# Patient Record
Sex: Female | Born: 1945 | Race: Black or African American | Hispanic: No | Marital: Single
Health system: Midwestern US, Community
[De-identification: ages and names within clinical notes are randomized; demographics above are authoritative.]

## PROBLEM LIST (undated history)

## (undated) DIAGNOSIS — K219 Gastro-esophageal reflux disease without esophagitis: Secondary | ICD-10-CM

## (undated) DIAGNOSIS — E785 Hyperlipidemia, unspecified: Secondary | ICD-10-CM

## (undated) DIAGNOSIS — F329 Major depressive disorder, single episode, unspecified: Secondary | ICD-10-CM

## (undated) DIAGNOSIS — M5410 Radiculopathy, site unspecified: Secondary | ICD-10-CM

## (undated) DIAGNOSIS — M549 Dorsalgia, unspecified: Secondary | ICD-10-CM

## (undated) DIAGNOSIS — H269 Unspecified cataract: Secondary | ICD-10-CM

## (undated) DIAGNOSIS — F32A Depression, unspecified: Secondary | ICD-10-CM

## (undated) DIAGNOSIS — B1920 Unspecified viral hepatitis C without hepatic coma: Secondary | ICD-10-CM

## (undated) DIAGNOSIS — M3500 Sicca syndrome, unspecified: Secondary | ICD-10-CM

## (undated) DIAGNOSIS — G629 Polyneuropathy, unspecified: Secondary | ICD-10-CM

## (undated) DIAGNOSIS — F331 Major depressive disorder, recurrent, moderate: Secondary | ICD-10-CM

## (undated) DIAGNOSIS — K621 Rectal polyp: Secondary | ICD-10-CM

## (undated) HISTORY — DX: Dorsalgia, unspecified: M54.9

## (undated) HISTORY — DX: Depression, unspecified: F32.A

## (undated) HISTORY — DX: Unspecified viral hepatitis C without hepatic coma: B19.20

## (undated) HISTORY — DX: Gastro-esophageal reflux disease without esophagitis: K21.9

## (undated) HISTORY — DX: Major depressive disorder, single episode, unspecified: F32.9

## (undated) HISTORY — DX: Sjogren syndrome, unspecified: M35.00

## (undated) HISTORY — DX: Unspecified cataract: H26.9

## (undated) HISTORY — PX: ABDOMINAL HYSTERECTOMY: SHX81

## (undated) HISTORY — DX: Hyperlipidemia, unspecified: E78.5

## (undated) HISTORY — DX: Radiculopathy, site unspecified: M54.10

---

## 2010-10-22 DIAGNOSIS — F17200 Nicotine dependence, unspecified, uncomplicated: Secondary | ICD-10-CM | POA: Insufficient documentation

## 2010-10-22 DIAGNOSIS — F609 Personality disorder, unspecified: Secondary | ICD-10-CM | POA: Insufficient documentation

## 2010-11-27 DIAGNOSIS — M2550 Pain in unspecified joint: Secondary | ICD-10-CM | POA: Insufficient documentation

## 2010-11-27 DIAGNOSIS — IMO0002 Reserved for concepts with insufficient information to code with codable children: Secondary | ICD-10-CM | POA: Insufficient documentation

## 2010-12-27 DIAGNOSIS — G629 Polyneuropathy, unspecified: Secondary | ICD-10-CM | POA: Insufficient documentation

## 2010-12-27 DIAGNOSIS — L280 Lichen simplex chronicus: Secondary | ICD-10-CM | POA: Insufficient documentation

## 2011-07-05 DIAGNOSIS — M549 Dorsalgia, unspecified: Secondary | ICD-10-CM | POA: Insufficient documentation

## 2011-11-09 DIAGNOSIS — M792 Neuralgia and neuritis, unspecified: Secondary | ICD-10-CM | POA: Insufficient documentation

## 2011-11-09 DIAGNOSIS — G894 Chronic pain syndrome: Secondary | ICD-10-CM | POA: Insufficient documentation

## 2013-02-08 DIAGNOSIS — F410 Panic disorder [episodic paroxysmal anxiety] without agoraphobia: Secondary | ICD-10-CM | POA: Insufficient documentation

## 2013-10-15 ENCOUNTER — Other Ambulatory Visit (HOSPITAL_COMMUNITY)
Admission: RE | Admit: 2013-10-15 | Discharge: 2013-10-15 | Disposition: A | Discharge status: Home / Self Care | Payer: Medicare Other | Source: Ambulatory Visit | Attending: Family Medicine | Admitting: Family Medicine

## 2013-10-15 DIAGNOSIS — Z1151 Encounter for screening for human papillomavirus (HPV): Secondary | ICD-10-CM | POA: Insufficient documentation

## 2013-10-15 DIAGNOSIS — Z124 Encounter for screening for malignant neoplasm of cervix: Secondary | ICD-10-CM | POA: Insufficient documentation

## 2014-05-11 ENCOUNTER — Ambulatory Visit: Payer: Self-pay | Admitting: Internal Medicine

## 2014-05-17 DIAGNOSIS — M3501 Sicca syndrome with keratoconjunctivitis: Secondary | ICD-10-CM | POA: Diagnosis not present

## 2014-05-17 DIAGNOSIS — H2513 Age-related nuclear cataract, bilateral: Secondary | ICD-10-CM | POA: Diagnosis not present

## 2014-05-17 DIAGNOSIS — H5371 Glare sensitivity: Secondary | ICD-10-CM | POA: Diagnosis not present

## 2014-06-03 DIAGNOSIS — M79674 Pain in right toe(s): Secondary | ICD-10-CM | POA: Diagnosis not present

## 2014-06-28 ENCOUNTER — Ambulatory Visit: Payer: Self-pay | Admitting: Internal Medicine

## 2014-11-11 DIAGNOSIS — M3500 Sicca syndrome, unspecified: Secondary | ICD-10-CM | POA: Insufficient documentation

## 2015-06-29 DIAGNOSIS — H25813 Combined forms of age-related cataract, bilateral: Secondary | ICD-10-CM | POA: Insufficient documentation

## 2015-06-29 DIAGNOSIS — H04123 Dry eye syndrome of bilateral lacrimal glands: Secondary | ICD-10-CM | POA: Insufficient documentation

## 2015-06-29 DIAGNOSIS — H02831 Dermatochalasis of right upper eyelid: Secondary | ICD-10-CM | POA: Insufficient documentation

## 2015-06-29 DIAGNOSIS — H02834 Dermatochalasis of left upper eyelid: Secondary | ICD-10-CM

## 2015-10-11 LAB — HM COLONOSCOPY

## 2016-08-20 ENCOUNTER — Telehealth: Payer: Self-pay

## 2016-08-20 NOTE — Telephone Encounter (Signed)
Completed.

## 2016-08-21 ENCOUNTER — Ambulatory Visit (INDEPENDENT_AMBULATORY_CARE_PROVIDER_SITE_OTHER): Payer: 59 | Admitting: Medical

## 2016-08-21 ENCOUNTER — Encounter: Payer: Self-pay | Admitting: Medical

## 2016-08-21 VITALS — BP 138/80 | HR 68 | Temp 98.6°F | Resp 16 | Ht 65.0 in | Wt 130.0 lb

## 2016-08-21 DIAGNOSIS — H269 Unspecified cataract: Secondary | ICD-10-CM

## 2016-08-21 DIAGNOSIS — E785 Hyperlipidemia, unspecified: Secondary | ICD-10-CM

## 2016-08-21 DIAGNOSIS — M3500 Sicca syndrome, unspecified: Secondary | ICD-10-CM

## 2016-08-21 DIAGNOSIS — F325 Major depressive disorder, single episode, in full remission: Secondary | ICD-10-CM | POA: Diagnosis not present

## 2016-08-21 DIAGNOSIS — G8929 Other chronic pain: Secondary | ICD-10-CM | POA: Diagnosis not present

## 2016-08-21 DIAGNOSIS — L819 Disorder of pigmentation, unspecified: Secondary | ICD-10-CM

## 2016-08-21 DIAGNOSIS — M5410 Radiculopathy, site unspecified: Secondary | ICD-10-CM

## 2016-08-21 DIAGNOSIS — K219 Gastro-esophageal reflux disease without esophagitis: Secondary | ICD-10-CM | POA: Diagnosis not present

## 2016-08-21 DIAGNOSIS — M5441 Lumbago with sciatica, right side: Secondary | ICD-10-CM | POA: Diagnosis not present

## 2016-08-21 NOTE — Progress Notes (Signed)
Subjective:    Patient ID: Latoya Cummings, female    DOB: 1945/10/11, 71 y.o.   MRN: [id number]  HPI  Pt states she feel ok today.  She updates me on her history. She has lumbar back pain history. No acute pain presetnl.   Some depression hx- In the past she was on medication. She states depression is controlled. In past she saw counselor/therapist.  Some gerd hx- she is on protonix. It does help.  She request derm referral. Pt has hx of  Hyperpigmented lesions on her feet. Worse on left heel.  Also she request rheumatologist referral as well. Pt has Sjogren syndrome. Pt states Dr Nickola Major.  Pt also request ophthalmologist referral. She has history of cataracts.   Review of Systems  Constitutional: Negative for chills and fever.  HENT: Negative for congestion, drooling, ear pain, hearing loss, postnasal drip, sinus pain and sneezing.   Eyes: Negative for redness and itching.       Hx of cataracts.  Respiratory: Negative for choking, shortness of breath and wheezing.   Cardiovascular: Negative for chest pain and palpitations.  Gastrointestinal: Negative for abdominal distention, abdominal pain, anal bleeding, blood in stool, diarrhea, rectal pain and vomiting.       Controlled gerd.  Genitourinary: Negative for dysuria and enuresis.  Musculoskeletal: Negative for back pain.       Radicular pain rt lower ext.  Low back pain and rt hip pain.  Skin: Negative for rash.       See hpi skin hyperpigmentation of feet.  Neurological: Negative for facial asymmetry and headaches.  Hematological: Negative for adenopathy. Does not bruise/bleed easily.  Psychiatric/Behavioral: Positive for dysphoric mood. Negative for behavioral problems, confusion, hallucinations, self-injury and suicidal ideas. The patient is not nervous/anxious.        Hx of but stable.    Past Medical History:  Diagnosis Date  . Depression   . Hyperlipidemia      Social History   Social History  . Marital  status: Single    Spouse name: N/A  . Number of children: N/A  . Years of education: N/A   Occupational History  . Not on file.   Social History Main Topics  . Smoking status: Never Smoker  . Smokeless tobacco: Never Used  . Alcohol use No  . Drug use: No  . Sexual activity: Not on file   Other Topics Concern  . Not on file   Social History Narrative  . No narrative on file    Past Surgical History:  Procedure Laterality Date  . CHOLECYSTECTOMY      Family History  Problem Relation Age of Onset  . Cancer Mother     Allergies  Allergen Reactions  . Tetanus Toxoids   . Tape Rash  . Tuberculin Ppd Rash    No current outpatient prescriptions on file prior to visit.   No current facility-administered medications on file prior to visit.     BP (!) 142/47   Pulse 68   Temp 98.6 F (37 C) (Oral)   Resp 16   Ht 5\' 5"  (1.651 m)   Wt 130 lb (59 kg)   SpO2 100%   BMI 21.63 kg/m       Objective:   Physical Exam  General Mental Status- Alert. General Appearance- Not in acute distress.   Skin General: Color- Normal Color. Moisture- Normal Moisture.  Bottom of her feet slight hyperpigmented area worse on heels  Neck Carotid Arteries- Normal color.  Moisture- Normal Moisture. No carotid bruits. No JVD.  Chest and Lung Exam Auscultation: Breath Sounds:-Normal.  Cardiovascular Auscultation:Rythm- Regular. Murmurs & Other Heart Sounds:Auscultation of the heart reveals- No Murmurs.  Abdomen Inspection:-Inspeection Normal. Palpation/Percussion:Note:No mass. Palpation and Percussion of the abdomen reveal- Non Tender, Non Distended + BS, no rebound or guarding.   Neurologic Cranial Nerve exam:- CN III-XII intact(No nystagmus), symmetric smile. Strength:- 5/5 equal and symmetric strength both upper and lower extremities.   Back No Mid lumbar spine tenderness to palpation. No Pain on straight leg lift. No Pain on lateral movements and flexion/extension  of the spine. But lying supine reports little pain in rt upper hamstring area.  Lower ext neurologic  L5-S1 sensation intact bilaterally. Normal patellar reflexes bilaterally. No foot drop bilaterally.      Assessment & Plan:  I have made the various referrals that you have request as listed on your summary.  For gerd continue protonix.   For depression which appears stable contact counselor office then let us know who you called and we will help facilitate the referral.  On orthopedist referral I have placed back specialist but will but that on hold until you call with more specifics.  Please signs release of information so we can get prior pcp recordds.  Follow up in 3 wks or as needed.(early morning)  Sutton Hirsch, Ramon DredgeEdward, PA-C

## 2016-08-21 NOTE — Patient Instructions (Addendum)
I have made the various referrals that you have request as listed on your summary.(for update can speak with Alen BleacherKrist or Victorino DikeJennifer)  For gerd continue protonix.   For depression which appears stable contact counselor office then let us know who you called and we will help facilitate the referral.  On orthopedist referral I have placed back specialist but will but that on hold until you call with more specifics.  Please signs release of information so we can get prior pcp recordds.  Follow up in 3 wks or as needed.(early morning)  For high cholesterol hx will get cmp and lipid panel future orer  Pt told needs shingles vaccine. She states insurance told her to get and covered.

## 2016-09-17 ENCOUNTER — Encounter: Payer: Self-pay | Admitting: Medical

## 2016-09-17 ENCOUNTER — Ambulatory Visit (HOSPITAL_BASED_OUTPATIENT_CLINIC_OR_DEPARTMENT_OTHER)
Admission: RE | Admit: 2016-09-17 | Discharge: 2016-09-17 | Disposition: A | Discharge status: Home / Self Care | Payer: Medicare Other | Source: Ambulatory Visit | Attending: Medical | Admitting: Medical

## 2016-09-17 ENCOUNTER — Ambulatory Visit (INDEPENDENT_AMBULATORY_CARE_PROVIDER_SITE_OTHER): Payer: 59 | Admitting: Psychology

## 2016-09-17 ENCOUNTER — Ambulatory Visit (INDEPENDENT_AMBULATORY_CARE_PROVIDER_SITE_OTHER): Payer: Medicare Other | Admitting: Medical

## 2016-09-17 VITALS — BP 129/51 | HR 64 | Temp 98.4°F | Resp 16 | Ht 65.0 in | Wt 131.0 lb

## 2016-09-17 DIAGNOSIS — M79609 Pain in unspecified limb: Secondary | ICD-10-CM | POA: Diagnosis not present

## 2016-09-17 DIAGNOSIS — E785 Hyperlipidemia, unspecified: Secondary | ICD-10-CM

## 2016-09-17 DIAGNOSIS — F331 Major depressive disorder, recurrent, moderate: Secondary | ICD-10-CM

## 2016-09-17 DIAGNOSIS — M79605 Pain in left leg: Secondary | ICD-10-CM | POA: Diagnosis not present

## 2016-09-17 LAB — COMPREHENSIVE METABOLIC PANEL
ALK PHOS: 62 U/L (ref 39–117)
ALT: 16 U/L (ref 0–35)
AST: 29 U/L (ref 0–37)
Albumin: 4.5 g/dL (ref 3.5–5.2)
BUN: 12 mg/dL (ref 6–23)
CHLORIDE: 103 meq/L (ref 96–112)
CO2: 32 meq/L (ref 19–32)
Calcium: 10.3 mg/dL (ref 8.4–10.5)
Creatinine, Ser: 0.77 mg/dL (ref 0.40–1.20)
GFR: 95.51 mL/min (ref >60.00)
GLUCOSE: 97 mg/dL (ref 70–99)
POTASSIUM: 4.1 meq/L (ref 3.5–5.1)
SODIUM: 141 meq/L (ref 135–145)
TOTAL PROTEIN: 8.1 g/dL (ref 6.0–8.3)
Total Bilirubin: 0.5 mg/dL (ref 0.2–1.2)

## 2016-09-17 LAB — LIPID PANEL
Cholesterol: 171 mg/dL (ref 0–200)
HDL: 53.3 mg/dL (ref >39.00)
LDL CALC: 96 mg/dL (ref 0–99)
NonHDL: 117.89
Total CHOL/HDL Ratio: 3
Triglycerides: 111 mg/dL (ref 0.0–149.0)
VLDL: 22.2 mg/dL (ref 0.0–40.0)

## 2016-09-17 NOTE — Patient Instructions (Addendum)
For your popliteal region pain present for one year or more, I ordered right lower extremity ultrasound. Appointment downstairs with radiology is at 11:30 am.  I suspect that you might have a Baker's cyst. You are you have orthopedic referral in place and getting result of the ultrasound would be helpful.  Please get the future labs are placed.  Attend counseling appointment today.  Follow-up date to be determined after review of ultrasound report.

## 2016-09-17 NOTE — Progress Notes (Signed)
Subjective:    Patient ID: Latoya Cummings, female    DOB: Feb 01, 1945, 71 y.o.   MRN: [id number]  HPI  Pt states history of some back pain in past. But no pain recently.   Pt does report some popliteal pain for about one year. Slight bulge to he popliteal area. Pt told possible baker cyst.  But no US done. Pain is constant and daily. No pretibial swelling and no shortness of breath. On review pt thinks after working out on treadmill thought little pain while exercising May have caused the pain.   Pt states that some pain in popliteal fossa at time radiates up and down leg.   Review of Systems  Constitutional: Negative for chills, fatigue and fever.  Respiratory: Negative for cough, chest tightness, shortness of breath and wheezing.   Cardiovascular: Negative for chest pain and palpitations.  Gastrointestinal: Negative for abdominal pain, anal bleeding, blood in stool, constipation, nausea and vomiting.  Musculoskeletal:       Rt lower ext popliteal region pain.  Neurological: Negative for dizziness, seizures, speech difficulty, weakness, light-headedness and headaches.  Hematological: Negative for adenopathy. Does not bruise/bleed easily.  Psychiatric/Behavioral: Negative for behavioral problems and confusion.    Past Medical History:  Diagnosis Date  . Back pain   . Cataract   . Depression   . Depression   . GERD (gastroesophageal reflux disease)   . Hyperlipidemia   . Radicular pain   . Sjogren's disease [facility])      Social History   Social History  . Marital status: Single    Spouse name: N/A  . Number of children: N/A  . Years of education: N/A   Occupational History  . Not on file.   Social History Main Topics  . Smoking status: Never Smoker  . Smokeless tobacco: Never Used  . Alcohol use No  . Drug use: No  . Sexual activity: No   Other Topics Concern  . Not on file   Social History Narrative  . No narrative on file    Past Surgical History:  Procedure  Laterality Date  . CHOLECYSTECTOMY      Family History  Problem Relation Age of Onset  . Cancer Mother     Allergies  Allergen Reactions  . Tetanus Toxoids   . Tape Rash  . Tuberculin Ppd Rash    Current Outpatient Prescriptions on File Prior to Visit  Medication Sig Dispense Refill  . aspirin EC 81 MG tablet Take by mouth daily.    Marland Kitchen gabapentin (NEURONTIN) 800 MG tablet Take 800 mg by mouth 2 (two) times daily.     Marland Kitchen ibuprofen (ADVIL,MOTRIN) 200 MG tablet Take 800 mg by mouth daily as needed.     . pantoprazole (PROTONIX) 40 MG tablet      No current facility-administered medications on file prior to visit.     BP (!) 129/51   Pulse 64   Temp 98.4 F (36.9 C) (Oral)   Resp 16   Ht  (1.651 m)   Wt 131 lb (59.4 kg)   SpO2 100%   BMI 21.80 kg/m      Objective:   Physical Exam  General- No acute distress. Pleasant patient. Neck- Full range of motion, no jvd Lungs- Clear, even and unlabored. Heart- regular rate and rhythm. Neurologic- CNII- XII grossly intact.  Rt lower ext- faint bulge on inspection of popliteal fossa. Faint pain rt side homans testing.      Assessment & Plan:  For your popliteal region pain present for one year or more, I ordered right lower extremity ultrasound. Appointment downstairs with radiology is at 11:30 am.  I suspect that you might have a Baker's cyst. You are you have orthopedic referral in place and getting result of the ultrasound would be helpful.  Please get the future labs are placed.  Attend counseling appointment today.  Follow-up date to be determined after review of ultrasound report.  Febe Champa, Ramon DredgeEdward, PA-C

## 2016-09-23 ENCOUNTER — Ambulatory Visit (INDEPENDENT_AMBULATORY_CARE_PROVIDER_SITE_OTHER): Payer: Medicaid Other

## 2016-09-23 ENCOUNTER — Ambulatory Visit (INDEPENDENT_AMBULATORY_CARE_PROVIDER_SITE_OTHER): Payer: 59

## 2016-09-23 ENCOUNTER — Ambulatory Visit (INDEPENDENT_AMBULATORY_CARE_PROVIDER_SITE_OTHER): Payer: Medicaid Other | Admitting: Orthopedic Surgery

## 2016-09-23 ENCOUNTER — Encounter (INDEPENDENT_AMBULATORY_CARE_PROVIDER_SITE_OTHER): Payer: Self-pay | Admitting: Orthopedic Surgery

## 2016-09-23 DIAGNOSIS — M79672 Pain in left foot: Secondary | ICD-10-CM | POA: Diagnosis not present

## 2016-09-23 DIAGNOSIS — M25561 Pain in right knee: Secondary | ICD-10-CM

## 2016-09-23 DIAGNOSIS — M5410 Radiculopathy, site unspecified: Secondary | ICD-10-CM

## 2016-09-26 NOTE — Progress Notes (Signed)
Office Visit Note   Patient: Latoya Cummings           Date of Birth: Aug 25, 1945           MRN: [id number] Visit Date: 09/23/2016 Requested by: Esperanza Richters, PA-C [address] [place], [place] [address] PCP: Marisue Brooklyn  Subjective: Chief Complaint  Patient presents with  . Right Knee - Pain  . Left Foot - Pain    HPI: Latoya Cummings is a 71 year old patient with multiple medical complaints.  She describes right lower extremity radicular pain radiating up into the groin.  She has an MRI scan which is reviewed.  The report states that at L3-4 there is facet hypertrophy and ligamentum flavum thickening which results in mild central and lateral recess stenosis.  The disc does contact the right greater than left descending L4 nerve roots.  There is a small right foraminal disc herniation present.  She describes pain for 2 years with no history of injury.  Does report numbness and tingling in the leg as well as groin pain.  She has been to physical therapy on 2 occasions.  Patient also describes right knee pain posteriorly with a radiating component down the leg.  Denies any mechanical symptoms of the knee swelling or history of injury.  No popping or clicking noted in the knee by description.  Patient also reports left foot pain.  She states that her symptoms all related to a small bunion she has along the first MTP joint.  She's had that pain for 1-2 years.  She can only wear certain shoes.  She has had radiographs done elsewhere.  She did not bring her records.              ROS: All systems reviewed are negative as they relate to the chief complaint within the history of present illness.  Patient denies  fevers or chills.   Assessment & Plan: Visit Diagnoses:  1. Radicular leg pain   2. 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  This should respond well to an injection.  Refer to Dr. Alvester Morin for that  problem.  I think the knee pain is likely related to referred pain from the back.  Her knee exam is normal and radiographs show no abnormalities.  In regards to the foot I think that she does have a very small bunion on the medial aspect of the first metatarsal.  I'll have her see Dr. Lajoyce Corners for further evaluation and management of that problem should she elect for surgical intervention.  Follow-Up Instructions: No Follow-up on file.   Orders:  Orders Placed This Encounter  Procedures  . XR Pelvis 1-2 Views  . XR KNEE 3 VIEW RIGHT  . XR Foot Complete Left  . Ambulatory referral to Physical Medicine Rehab   No orders of the defined types were placed in this encounter.     Procedures: No procedures performed   Clinical Data: No additional findings.  Objective: Vital Signs: There were no vitals taken for this visit.  Physical Exam:   Constitutional: Patient appears well-developed HEENT:  Head: Normocephalic Eyes:EOM are normal Neck: Normal range of motion Cardiovascular: Normal rate Pulmonary/chest: Effort normal Neurologic: Patient is alert Skin: Skin is warm Psychiatric: Patient has normal mood and affect    Ortho Exam: Orthopedic exam demonstrates palpable pedal pulses good ankle dorsi flexion plantar flexion quite hamstring strength.  Symmetric reflexes.  Negative Babinski negative clonus.  Little bit of L45 paresthesias on the right compared to the left.  Nerve root tension signs present on the right negative on the left.  There is some pain with forward lateral bending.  Examination of the left foot demonstrates reasonable range of motion at the first MTP joint with palpable intact nontender in 2 to posterior tib peroneal and Achilles tendons.  Symmetric tibiotalar subtalar transverse tarsal range of motion is present.  No other masses lymph adenopathy or skin changes noted in that lower extremity region.  Knee examination demonstrates full active and passive range of  motion stable collateral crucial ligaments intact extensor mechanism palpable pedal pulses no other masses lymph adenopathy or skin changes noted in the right knee region.  Range of motion is full.  Specialty Comments:  No specialty comments available.  Imaging: No results found.   PMFS History: There are no active problems to display for this patient.  Past Medical History:  Diagnosis Date  . Back pain   . Cataract   . Depression   . Depression   . GERD (gastroesophageal reflux disease)   . Hyperlipidemia   . Radicular pain   . Sjogren's disease (HCC)     Family History  Problem Relation Age of Onset  . Cancer Mother     Past Surgical History:  Procedure Laterality Date  . CHOLECYSTECTOMY     Social History   Occupational History  . Not on file.   Social History Main Topics  . Smoking status: Never Smoker  . Smokeless tobacco: Never Used  . Alcohol use No  . Drug use: No  . Sexual activity: No

## 2016-10-02 ENCOUNTER — Encounter (INDEPENDENT_AMBULATORY_CARE_PROVIDER_SITE_OTHER): Payer: Self-pay | Admitting: Physical Medicine and Rehabilitation

## 2016-10-02 ENCOUNTER — Ambulatory Visit (INDEPENDENT_AMBULATORY_CARE_PROVIDER_SITE_OTHER): Payer: 59 | Admitting: Physical Medicine and Rehabilitation

## 2016-10-02 VITALS — BP 120/62 | HR 60

## 2016-10-02 DIAGNOSIS — M25561 Pain in right knee: Secondary | ICD-10-CM | POA: Diagnosis not present

## 2016-10-02 DIAGNOSIS — M25551 Pain in right hip: Secondary | ICD-10-CM

## 2016-10-02 DIAGNOSIS — G8929 Other chronic pain: Secondary | ICD-10-CM | POA: Diagnosis not present

## 2016-10-02 DIAGNOSIS — M5416 Radiculopathy, lumbar region: Secondary | ICD-10-CM | POA: Diagnosis not present

## 2016-10-02 NOTE — Progress Notes (Deleted)
Pain posterior right knee, right thigh, right groin. Feels like "pulling" sensation in knee. No back pain. Had MRI in [facility] ordered by her neurologist and was told the pain may be coming from her back. The pain is always there, but it increases with activity such as walking fast. She also asks about a possible referral to a neurologist within [facility].

## 2016-10-04 ENCOUNTER — Telehealth: Payer: Self-pay | Admitting: Medical

## 2016-10-04 NOTE — Telephone Encounter (Signed)
Pt called states it is urgent that [employer] call her today after 1:00 pm. Pt did not want to leave info as to why she needs to speak to him. [phone].

## 2016-10-04 NOTE — Telephone Encounter (Signed)
Pt called back in to request a call back Explained that provider is currently seeing pt's but would send message   CB: [phone]

## 2016-10-07 ENCOUNTER — Other Ambulatory Visit: Payer: Self-pay | Admitting: Medical

## 2016-10-07 LAB — HM MAMMOGRAPHY

## 2016-10-07 NOTE — Telephone Encounter (Signed)
Pt states she needs a different dermatology referral for her feet. Call pt [phone].  Pt states she will have her pharmacy fax over her medication refill requests so she can be sure it is correct.

## 2016-10-08 ENCOUNTER — Telehealth: Payer: Self-pay | Admitting: Medical

## 2016-10-08 ENCOUNTER — Ambulatory Visit (INDEPENDENT_AMBULATORY_CARE_PROVIDER_SITE_OTHER): Payer: 59 | Admitting: Physical Medicine and Rehabilitation

## 2016-10-08 ENCOUNTER — Ambulatory Visit (INDEPENDENT_AMBULATORY_CARE_PROVIDER_SITE_OTHER): Payer: Medicare Other

## 2016-10-08 VITALS — BP 137/68 | HR 71 | Temp 98.9°F

## 2016-10-08 DIAGNOSIS — M25551 Pain in right hip: Secondary | ICD-10-CM | POA: Diagnosis not present

## 2016-10-08 DIAGNOSIS — M5416 Radiculopathy, lumbar region: Secondary | ICD-10-CM | POA: Diagnosis not present

## 2016-10-08 MED ORDER — BETAMETHASONE SOD PHOS & ACET 6 (3-3) MG/ML IJ SUSP
12.0000 mg | Freq: Once | INTRAMUSCULAR | Status: AC
  Administered 2016-10-08: 12 mg

## 2016-10-08 MED ORDER — LIDOCAINE HCL (PF) 1 % IJ SOLN
2.0000 mL | Freq: Once | INTRAMUSCULAR | Status: AC
  Administered 2016-10-08: 2 mL

## 2016-10-08 NOTE — Procedures (Signed)
[name] is a 71 year old female hear for planned right L4 transforaminal injection.  Please see prior evaluation and management note for details and justification.   Lumbosacral Transforaminal Epidural Steroid Injection - Sub-Pedicular Approach with Fluoroscopic Guidance  Patient: Latoya Cummings      Date of Birth: 1946-01-04 MRN: [id number] PCP: Esperanza Richters, PA-C      Visit Date: 10/08/2016   Universal Protocol:    Date/Time: 10/08/2016  Consent Given By: the patient  Position: PRONE  Additional Comments: Vital signs were monitored before and after the procedure. Patient was prepped and draped in the usual sterile fashion. The correct patient, procedure, and site was verified.   Injection Procedure Details:  Procedure Site One Meds Administered:  Meds ordered this encounter  Medications  . lidocaine (PF) (XYLOCAINE) 1 % injection 2 mL  . betamethasone acetate-betamethasone sodium phosphate (CELESTONE) injection 12 mg    Laterality: Right  Location/Site:  L4-L5  Needle size: 22 G  Needle type: Spinal  Needle Placement: Transforaminal  Findings:  -Contrast Used: 0.5 mL iohexol 180 mg iodine/mL   -Comments: Excellent flow of contrast along the nerve and into the epidural space.  Procedure Details: After squaring off the end-plates to get a true AP view, the C-arm was positioned so that an oblique view of the foramen as noted above was visualized. The target area is just inferior to the "nose of the scotty dog" or sub pedicular. The soft tissues overlying this structure were infiltrated with 2-3 ml. of 1% Lidocaine without Epinephrine.  The spinal needle was inserted toward the target using a "trajectory" view along the fluoroscope beam.  Under AP and lateral visualization, the needle was advanced so it did not puncture dura and was located close the 6 O'Clock position of the pedical in AP tracterory. Biplanar projections were used to confirm position. Aspiration was  confirmed to be negative for CSF and/or blood. A 1-2 ml. volume of Isovue-250 was injected and flow of contrast was noted at each level. Radiographs were obtained for documentation purposes.   After attaining the desired flow of contrast documented above, a 0.5 to 1.0 ml test dose of 0.25% Marcaine was injected into each respective transforaminal space.  The patient was observed for 90 seconds post injection.  After no sensory deficits were reported, and normal lower extremity motor function was noted,   the above injectate was administered so that equal amounts of the injectate were placed at each foramen (level) into the transforaminal epidural space.   Additional Comments:  The patient tolerated the procedure well Dressing: Band-Aid    Post-procedure details: Patient was observed during the procedure. Post-procedure instructions were reviewed.  Patient left the clinic in stable condition.

## 2016-10-08 NOTE — Patient Instructions (Signed)

## 2016-10-08 NOTE — Telephone Encounter (Signed)
I did see my dermatology comments buried in the body of the  Note. I did send a referral request back to [facility] you call patient and let her know to call Victorino Dike or [place] in one week if nobody has called her by then.

## 2016-10-08 NOTE — Telephone Encounter (Signed)
Referral sent again, awaiting appt

## 2016-10-08 NOTE — Telephone Encounter (Signed)
Would someone call back patient and clarify why she wants to be referred to dermatologist? I referred him to physical medicine doctor. And in that physical medicine doctor there is mention of neurology referral?  So I'm not sure how dermatology came up?  In my last note I don't see anything about her dermatologic condition that she mentioned. So patient has a skin condition that needs to be addressed I would like to see her first before referring.

## 2016-10-08 NOTE — Progress Notes (Deleted)
Patient here today with right sided leg pain. She denies any actual back pain, but having entire leg pain.

## 2016-10-08 NOTE — Telephone Encounter (Signed)
Will you see the 08/21/2016 referral and placed again with the dermatologist. Apparently they never called her back and patient continues to try to get dermatologist referral.

## 2016-10-08 NOTE — Telephone Encounter (Signed)
Called pt she confirmed Latoya Cummings saw her here on 08/21/16 for an office visit for multiple issues including her hyperpigmentation of her feet. Edward placed a dermatology referral on 08/22/16 to [facility] Dr Joanna Puff. Pt states she has been unsuccessful in reaching them to schedule so she would like a referral to a different dermatology group. As far as her meds, she says [facility] in [place] is going to fax over refill req for Gabapentin and Flonase and to be on the lookout for those.

## 2016-10-11 ENCOUNTER — Telehealth (INDEPENDENT_AMBULATORY_CARE_PROVIDER_SITE_OTHER): Payer: Self-pay | Admitting: Radiology

## 2016-10-11 NOTE — Telephone Encounter (Signed)
Called pt lmg per Esperanza Richters instructions to have pt contact our office in one week if dermatology has not contacted her to make appt.

## 2016-10-11 NOTE — Telephone Encounter (Signed)
Patient called and LM to tell you guys congratulations, that she is feeling so much better, the injection went well and she is walking so much better now.  Thanks to all the [facility] staff, you were wonderful.

## 2016-10-22 ENCOUNTER — Encounter (INDEPENDENT_AMBULATORY_CARE_PROVIDER_SITE_OTHER): Payer: Self-pay | Admitting: Physical Medicine and Rehabilitation

## 2016-10-22 NOTE — Progress Notes (Signed)
Latoya Cummings - 71 y.o. female MRN [id number]  Date of birth: [birth year]  Office Visit Note: Visit Date: 10/02/2016 PCP: Esperanza Richters, PA-C Referred by: Esperanza Richters, PA-C  Subjective: Chief Complaint  Patient presents with  . Right Hip - Pain  . Right Knee - Pain  . Right Leg - Pain   HPI: Latoya Cummings is a 71 year old female recently seen in our office by Dr. August Saucer for orthopedic complaints. He felt like a lot of her pain may be coming from a disc herniation at L3-4 with some irritation of the L4 nerve roots and mild to moderate stenosis. We had her coming in today for evaluation because of multiple areas of complaints just to see if we can figure out where her pain was really originating from. She reports pain in the posterior right knee. Dr. August Saucer evaluating this and didn't feel like it was intrinsic knee issues. She has pain in the right thigh and right groin. She feels that there is a pulling sensation in the knee. She doesn't really report much in the way of back pain per se. She reports constant pain that increases with activity such as walking and in particular walking fast. She has failed conservative care with medication management and therapy. She reports an MRI of her lumbar spine was performed [facility] by her neurologist that she was seeing. Dr. August Saucer reviewed this and I reviewed this with her today and is reviewed below. It does show really mild bulging and facet hypertrophy at L3-4 with some possible irritation of either L4 nerve root. She has similar findings at L4-5. 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 All this was discussed with her. She also asked today about a referral to a neurologist within the [facility]. I did review some of the notes of the neurologist at [facility] been trying to manage her back and leg pain. It looks like electrodiagnostic study was performed but I cannot see the results of  this. She has not had prior lumbar surgery. She has not had prior injections of the lumbar spine. She denies any real paresthesias.    Review of Systems  Constitutional: Negative for chills, fever, malaise/fatigue and weight loss.  HENT: Negative for hearing loss and sinus pain.   Eyes: Negative for blurred vision, double vision and photophobia.  Respiratory: Negative for cough and shortness of breath.   Cardiovascular: Negative for chest pain, palpitations and leg swelling.  Gastrointestinal: Negative for abdominal pain, nausea and vomiting.  Genitourinary: Negative for flank pain.  Musculoskeletal: Positive for back pain and joint pain. Negative for myalgias.  Skin: Negative for itching and rash.  Neurological: Negative for tremors, focal weakness and weakness.  Endo/Heme/Allergies: Negative.   Psychiatric/Behavioral: Negative for depression.  All other systems reviewed and are negative.  Otherwise per HPI.  Assessment & Plan: Visit Diagnoses:  1. Pain in right hip   2. Chronic pain of right knee   3. Lumbar radiculopathy     Plan: Findings:  Chronic worsening severe at times right hip and leg pain and knee pain which could be related to the spine. She has symptoms that seem to be a little bit all over the place in terms of mechanical and possibly nerve root irritation. I think the fact that she has not had diagnostic transforaminal-type injection it would be worthwhile to complete a right L4 transforaminal injection which would be the nerve root he would  expect to probably give her pain in the knee and leg area. If she does not get relief with this that I would have her follow up with Dr. August Saucer for her knee pain. Her lumbar spine in general doesn't look very bad. She has some mild age-related findings with mild disc bulging and mild to moderate narrowing but no specific nerve root compression. There is this right foraminal disc herniation L3-4 which is smaller and lateral recess narrowing  as well. She will continue on current medications. I would ask her to follow-up with her primary care physician for referral to a [facility] neurologist. Her primary care physician is with a  health care.     Meds & Orders: No orders of the defined types were placed in this encounter.  No orders of the defined types were placed in this encounter.   Follow-up: Return for Right L4 transforaminal epidural steroid injection.   Procedures: No procedures performed  No notes on file   Clinical History: MRI LUMBAR SPINE WITHOUT CONTRAST, 06/11/2016 2:58 PM   INDICATION: LOW BACK PAIN, >6 WKS / RED FLAG(S) / RADICULOPATHY \ \ M54.10 Radicular leg pain   COMPARISON: None.   TECHNIQUE: Multiplanar, multi-sequence surface-coil MR imaging of the lumbar spine was performed without contrast.   LEVELS IMAGED: Lower thoracic to upper sacral region.   FINDINGS:  Alignment: Within normal limits. Vertebrae: Mild generalized heterogeneity of marrow signal is nonspecific, but can be seen in the setting of chronic anemia, chronic hypoxia (such as in smokers) versus other systemic metabolic disorders. No definite focal marrow replacing lesion suggestive of neoplasm. Conus: Terminates in borderline low position at the inferior margin of the L2 endplate. Visualized distal cord and conus are normal in signal and contour.   T12-L1: No significant focal abnormality. L1-L2: Mild facet hypertrophy and ligamentum flavum thickening. No canal or foraminal stenosis. L2-L3: Mild facet hypertrophy and ligamentum flavum thickening. Minimal disc bulge. No canal or foraminal stenosis. L3-L4: Mild disc bulge with facet hypertrophy and ligamentum flavum thickening that result in mild central and lateral recess stenosis. Disc contacts the right greater than left descending L4 nerve roots. Small right foraminal disc herniation with minimal right foraminal narrowing. L4-L5: Disc bulge and facet  hypertrophy/ligamentum flavum thickening. These changes result in mild to moderate central and lateral recess stenosis. No substantial canal stenosis. L5-S1: Mild disc bulge with facet hypertrophy and ligamentum flavum thickening. Mild narrowing of the lateral recesses. No substantial foraminal stenosis. Upper sacrum/ilium: Right S2-S3 Tarlov cyst that mildly deforms the S2 nerve root. Other Result Information Interface, Rad Results In - 06/11/2016  3:13 PM EDT MRI LUMBAR SPINE WITHOUT CONTRAST, 06/11/2016 2:58 PM   INDICATION: LOW BACK PAIN, >6 WKS / RED FLAG(S) / RADICULOPATHY \ \ M54.10 Radicular leg pain   COMPARISON: None.   TECHNIQUE: Multiplanar, multi-sequence surface-coil MR imaging of the lumbar spine was performed without contrast.   LEVELS IMAGED: Lower thoracic to upper sacral region.   FINDINGS:  Alignment: Within normal limits. Vertebrae: Mild generalized heterogeneity of marrow signal is nonspecific, but can be seen in the setting of chronic anemia, chronic hypoxia (such as in smokers) versus other systemic metabolic disorders. No definite focal marrow replacing lesion suggestive of neoplasm. Conus: Terminates in borderline low position at the inferior margin of the L2 endplate. Visualized distal cord and conus are normal in signal and contour.      T12-L1: No significant focal abnormality. L1-L2: Mild facet hypertrophy and ligamentum flavum thickening. No canal or foraminal  stenosis. L2-L3: Mild facet hypertrophy and ligamentum flavum thickening. Minimal disc bulge. No canal or foraminal stenosis. L3-L4: Mild disc bulge with facet hypertrophy and ligamentum flavum thickening that result in mild central and lateral recess stenosis. Disc contacts the right greater than left descending L4 nerve roots. Small right foraminal disc herniation with minimal right foraminal narrowing. L4-L5: Disc bulge and facet hypertrophy/ligamentum flavum thickening. These changes result in mild  to moderate central and lateral recess stenosis. No substantial canal stenosis. L5-S1: Mild disc bulge with facet hypertrophy and ligamentum flavum thickening. Mild narrowing of the lateral recesses. No substantial foraminal stenosis. Upper sacrum/ilium: Right S2-S3 Tarlov cyst that mildly deforms the S2 nerve root.   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 Disc bulge at L3-L4 with a small superimposed right foraminal disc herniation, but no substantial foraminal stenosis.  Small right S2-S3 sacral Tarlov cyst mildly deforms the adjacent S2 nerve root.  Borderline low position of the conus ending at the inferior L2 endplate, but otherwise normal signal and appearance of the distal cord and conus.  She reports that she has never smoked. She has never used smokeless tobacco. No results for input(s): HGBA1C, LABURIC in the last 8760 hours.  Objective:  VS:  HT:    WT:   BMI:     BP:120/62  HR:60bpm  TEMP: ( )  RESP:  Physical Exam  Constitutional: She is oriented to person, place, and time. She appears well-developed and well-nourished.  Eyes: Pupils are equal, round, and reactive to light. Conjunctivae and EOM are normal.  Cardiovascular: Normal rate and intact distal pulses.   Pulmonary/Chest: Effort normal.  Musculoskeletal:  Patient ambulates without a she is somewhat slow to rise from a seated position. She does have pain with extension of the lumbar spine. She has mild pain over the right greater trochanter PSIS compared to the left. She is not having any groin pain with hip rotation. Examination of the knee shows no swelling and no joint line tenderness. She has good varus and valgus instability. She has good distal strength without clonus.  Neurological: She is alert and oriented to person, place, and time. She exhibits normal muscle tone.  Skin: Skin is warm and dry. No rash noted. No  erythema.  Psychiatric: She has a normal mood and affect. Her behavior is normal.  Nursing note and vitals reviewed.   Ortho Exam Imaging: No results found.  Past Medical/Family/Surgical/Social History: Medications & Allergies reviewed per EMR There are no active problems to display for this patient.  Past Medical History:  Diagnosis Date  . Back pain   . Cataract   . Depression   . Depression   . GERD (gastroesophageal reflux disease)   . Hyperlipidemia   . Radicular pain   . Sjogren's disease (HCC)    Family History  Problem Relation Age of Onset  . Cancer Mother    Past Surgical History:  Procedure Laterality Date  . CHOLECYSTECTOMY     Social History   Occupational History  . Not on file.   Social History Main Topics  . Smoking status: Never Smoker  . Smokeless tobacco: Never Used  . Alcohol use No  . Drug use: No  . Sexual activity: No

## 2016-10-28 ENCOUNTER — Other Ambulatory Visit (HOSPITAL_COMMUNITY): Payer: Self-pay

## 2016-10-28 DIAGNOSIS — R1310 Dysphagia, unspecified: Secondary | ICD-10-CM

## 2016-11-07 ENCOUNTER — Ambulatory Visit (HOSPITAL_COMMUNITY)
Admission: RE | Admit: 2016-11-07 | Discharge: 2016-11-07 | Disposition: A | Discharge status: Home / Self Care | Payer: Medicare Other | Source: Ambulatory Visit | Attending: Physician Assistant | Admitting: Physician Assistant

## 2016-11-07 ENCOUNTER — Ambulatory Visit (HOSPITAL_COMMUNITY)
Admission: RE | Admit: 2016-11-07 | Discharge: 2016-11-07 | Disposition: A | Discharge status: Home / Self Care | Payer: 59 | Source: Ambulatory Visit | Attending: Medical | Admitting: Medical

## 2016-11-07 DIAGNOSIS — K219 Gastro-esophageal reflux disease without esophagitis: Secondary | ICD-10-CM | POA: Diagnosis not present

## 2016-11-07 DIAGNOSIS — F329 Major depressive disorder, single episode, unspecified: Secondary | ICD-10-CM | POA: Diagnosis not present

## 2016-11-07 DIAGNOSIS — R1311 Dysphagia, oral phase: Secondary | ICD-10-CM | POA: Diagnosis not present

## 2016-11-07 DIAGNOSIS — Z8673 Personal history of transient ischemic attack (TIA), and cerebral infarction without residual deficits: Secondary | ICD-10-CM | POA: Diagnosis not present

## 2016-11-07 DIAGNOSIS — R1310 Dysphagia, unspecified: Secondary | ICD-10-CM | POA: Diagnosis not present

## 2016-11-07 DIAGNOSIS — E785 Hyperlipidemia, unspecified: Secondary | ICD-10-CM | POA: Diagnosis not present

## 2016-11-07 DIAGNOSIS — K5900 Constipation, unspecified: Secondary | ICD-10-CM | POA: Diagnosis not present

## 2016-11-07 DIAGNOSIS — M3500 Sicca syndrome, unspecified: Secondary | ICD-10-CM | POA: Diagnosis not present

## 2016-12-03 ENCOUNTER — Telehealth: Payer: Self-pay | Admitting: Medical

## 2016-12-03 ENCOUNTER — Telehealth: Payer: Self-pay

## 2016-12-03 NOTE — Telephone Encounter (Signed)
Pt has hepatitis study positive recently.     She was told by her rheumatologist that she needs to see the specialist.  She was [facility] but she was under the impression that she could see [facility].  After discussion with the patient patient wants me to get records from the rheumatologist and review those.  She currently will keep the appointment with [facility] hepatitis clinic.  I explained to her once I review the records, I can check and see if [facility] affiliated specialist  that can see her.  Would you call Rheumatologist office/Dr. Hawkes(on [address] per pt) get records/labs faxed over so I can review. Let Dr. Nickola MajorHawkes office know I talked with pt and she wants to go ahead and see [facility].  Does [facility] have [facility] C [facility]?

## 2016-12-03 NOTE — Telephone Encounter (Signed)
Pt requesting to speak with you regarding lab results?

## 2016-12-03 NOTE — Telephone Encounter (Signed)
Copied from CRM [id number]. Topic: Inquiry >> Dec 03, 2016  4:29 PM Windy KalataMichael, Taylor L, NT wrote: Reason for CRM: pt states she got a report back about some blood work that she would like to discuss with her doctor, she states it is urgent, if not today a call back in the morning.

## 2016-12-05 NOTE — Telephone Encounter (Signed)
Labs and notes on PCP desk

## 2016-12-07 DIAGNOSIS — B1920 Unspecified viral hepatitis C without hepatic coma: Secondary | ICD-10-CM

## 2016-12-07 HISTORY — DX: Unspecified viral hepatitis C without hepatic coma: B19.20

## 2016-12-10 NOTE — Telephone Encounter (Signed)
See telephone note 12/03/2016

## 2016-12-26 ENCOUNTER — Telehealth: Payer: Self-pay | Admitting: Medical

## 2016-12-26 NOTE — Telephone Encounter (Signed)
Patient needs appointment before I make the referral.  Looks like I have appointment around 1:15 or 1:30 tomorrow afternoon.

## 2016-12-26 NOTE — Telephone Encounter (Signed)
Copied from CRM [id number]. Topic: Quick Communication - See Telephone Encounter >> Dec 26, 2016  8:30 AM Elliot GaultBell, Tiffany M wrote: CRM for notification. See Telephone encounter for:   12/26/16.  Relation to pt: self  Call back number:[phone]   Reason for call:  Patient experiencing mass and knot on the upper gum of mouth, patient requesting referral to ENT, please advise

## 2016-12-27 NOTE — Telephone Encounter (Signed)
Patient checking on the status of ENT referral. Patient scheduled with PCP for Wed 01/08/2017 at 1:15pm. Patient unable to come in sooner.

## 2016-12-27 NOTE — Telephone Encounter (Signed)
Please call and schedule appointment.

## 2017-01-02 NOTE — Telephone Encounter (Signed)
No referral placed. This is new problem. I don't think I have seen her for this. See my 12/26/2016 comment.

## 2017-01-02 NOTE — Telephone Encounter (Signed)
Call patient on 01/02/2017 @ 4:15pm and she stts she will wait til her appointment

## 2017-01-06 ENCOUNTER — Telehealth (INDEPENDENT_AMBULATORY_CARE_PROVIDER_SITE_OTHER): Payer: Self-pay | Admitting: Physical Medicine and Rehabilitation

## 2017-01-06 ENCOUNTER — Other Ambulatory Visit: Payer: Self-pay | Admitting: Medical

## 2017-01-06 NOTE — Telephone Encounter (Signed)
If she has not had PT recently then we could try that for some baseline treatment and education. [place] in [place]. We can send or have her pick up RX for PT. [facility] [place]

## 2017-01-08 ENCOUNTER — Encounter: Payer: Self-pay | Admitting: Medical

## 2017-01-08 ENCOUNTER — Ambulatory Visit (INDEPENDENT_AMBULATORY_CARE_PROVIDER_SITE_OTHER): Payer: 59 | Admitting: Medical

## 2017-01-08 VITALS — BP 135/53 | HR 66 | Temp 98.0°F | Resp 16 | Ht 65.0 in | Wt 141.0 lb

## 2017-01-08 DIAGNOSIS — R632 Polyphagia: Secondary | ICD-10-CM | POA: Diagnosis not present

## 2017-01-08 DIAGNOSIS — M270 Developmental disorders of jaws: Secondary | ICD-10-CM

## 2017-01-08 DIAGNOSIS — J029 Acute pharyngitis, unspecified: Secondary | ICD-10-CM | POA: Diagnosis not present

## 2017-01-08 NOTE — Telephone Encounter (Signed)
Ok, actually sounds like good plan, start regular rx pad with her name and we can send to her or have her pick up

## 2017-01-08 NOTE — Telephone Encounter (Signed)
Patient said she had 3 sessions at [facility] in [place] in May or June of 2018. She said this was before the injections, and it did not really help then because the pain was so bad. She wants to possibly try again at the same facility since she is doing better since injection.

## 2017-01-08 NOTE — Patient Instructions (Addendum)
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  You expressed desire to be referred to ENT for this and for your sore throat.  You describe symptoms for 1 year.  I will go ahead and make the referral but today went ahead and did rapid strep test which was negative and then also sent out throat culture.  Your palate or throat symptoms worsen or change prior to the referral please let us know.  Regarding your report of eating excessively, I offered metabolic panel today to check for diabetes but you declined.  Please note if you have other symptoms such as frequent urination or more very thirsty then would definitely go ahead and get that before your physical.  Also I do want to note that your BMI is less than 30.  However still try to eat a balanced healthy diet.  Follow-up in a month for complete physical.  You had mentioned you want your insurance to come out and do initial evaluation first.  Waiting a little bit would be okay but would not recommend waiting past month.

## 2017-01-08 NOTE — Progress Notes (Signed)
Subjective:    Patient ID: Latoya Cummings, female    DOB: 02/18/1945, 72 y.o.   MRN: [id number]  HPI  Pt in for some raised area on her rt upper palate and irregular appearance to her back tonsil area(slight more red than left side per pt). This has been present for about a year and she wanted to get evaluated.. She now wants referral to ENT. She had sent message to staff wanted referral. But I wanted her to come in so I could evaluate the area to determine how fast to ask for referral/and to get appropiate info for the referral.   No fever, no chills or sweats.   Pt states mild tight sensation when she eats. But no pain.  Pt used to smoke but she stopped in 1999.   Last saw Dentist Jan 23, 2016. No report of abnormalities given to patient.    Review of Systems  Constitutional: Negative for chills, diaphoresis, fatigue and fever.  HENT: Negative for congestion, ear pain, nosebleeds, rhinorrhea, sinus pressure and tinnitus.        See hpi exam.  Respiratory: Negative for cough, chest tightness, shortness of breath and wheezing.   Cardiovascular: Negative for chest pain and palpitations.  Gastrointestinal: Negative for abdominal distention, abdominal pain, blood in stool, nausea and vomiting.  Endocrine: Negative for polydipsia and polyuria.       She feels like eats too much.  Musculoskeletal: Negative for back pain, gait problem, myalgias, neck pain and neck stiffness.  Skin: Negative for rash.  Hematological: Negative for adenopathy. Does not bruise/bleed easily.  Psychiatric/Behavioral: Negative for behavioral problems, confusion and decreased concentration.   Past Medical History:  Diagnosis Date  . Back pain   . Cataract   . Depression   . Depression   . GERD (gastroesophageal reflux disease)   . Hyperlipidemia   . Radicular pain   . Sjogren's disease (HCC)      Social History   Socioeconomic History  . Marital status: Single    Spouse name: Not on file  . Number  of children: Not on file  . Years of education: Not on file  . Highest education level: Not on file  Social Needs  . Financial resource strain: Not on file  . Food insecurity - worry: Not on file  . Food insecurity - inability: Not on file  . Transportation needs - medical: Not on file  . Transportation needs - non-medical: Not on file  Occupational History  . Not on file  Tobacco Use  . Smoking status: Never Smoker  . Smokeless tobacco: Never Used  Substance and Sexual Activity  . Alcohol use: No  . Drug use: No  . Sexual activity: No  Other Topics Concern  . Not on file  Social History Narrative  . Not on file    Past Surgical History:  Procedure Laterality Date  . CHOLECYSTECTOMY      Family History  Problem Relation Age of Onset  . Cancer Mother     Allergies  Allergen Reactions  . Tape Rash  . Tuberculin Ppd Rash    Current Outpatient Medications on File Prior to Visit  Medication Sig Dispense Refill  . aspirin EC 81 MG tablet Take by mouth daily.    . fluticasone (FLONASE) 50 MCG/ACT nasal spray USE 2 SPRAYS BY NASAL ROUTE DAILY. 16 g 2  . gabapentin (NEURONTIN) 800 MG tablet TAKE 1 TABLET IN THE MORNING AND TAKE 2 TABLETS AT BEDTIME 270 tablet  0  . ibuprofen (ADVIL,MOTRIN) 200 MG tablet Take 800 mg by mouth daily as needed.     . pantoprazole (PROTONIX) 40 MG tablet      No current facility-administered medications on file prior to visit.     BP (!) 135/53   Pulse 66   Temp 98 F (36.7 C) (Oral)   Resp 16   Ht 5\' 5"  (1.651 m)   Wt 141 lb (64 kg)   SpO2 100%   BMI 23.46 kg/m       Objective:   Physical Exam  General  Mental Status - Alert. General Appearance - Well groomed. Not in acute distress.  Skin Rashes- No Rashes.  HEENT Head- Normal. Ear Auditory Canal - Left- Normal. Right - Normal.Tympanic Membrane- Left- Normal. Right- Normal. Eye Sclera/Conjunctiva- Left- Normal. Right- Normal. Nose & Sinuses Nasal Mucosa- Left-  Boggy  and Congested. Right-  Boggy and  Congested.Bilateral  No maxillary and no  frontal sinus pressure. Mouth & Throat Lips: Upper Lip- Normal: no dryness, cracking, pallor, cyanosis, or vesicular eruption. Lower Lip-Normal: no dryness, cracking, pallor, cyanosis or vesicular eruption. Buccal Mucosa- Bilateral- No Aphthous ulcers. 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 Size/Enlargement- Bilateral- No enlargement. Discharge- bilateral-None.  Neck Neck- Supple. No Masses. No lymphadenpathy.   Chest and Lung Exam Auscultation: Breath Sounds:-Clear even and unlabored.  Cardiovascular Auscultation:Rythm- Regular, rate and rhythm. Murmurs & Other Heart Sounds:Ausculatation of the heart reveal- No Murmurs.  Lymphatic Head & Neck General Head & Neck Lymphatics: Bilateral: Description- No Localized lymphadenopathy.      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  You expressed desire to be referred to ENT for this and for your sore throat.  You describe symptoms for 1 year.  I will go ahead and make the referral but today went ahead and did rapid strep test which was negative and then also sent out throat culture.  Your palate or throat symptoms worsen or change prior to the referral please let us know.  Regarding your report of eating excessively, I offered metabolic panel today to check for diabetes but you declined.  Please note if you have other symptoms such as frequent urination or more very thirsty then would definitely go ahead and get that before your physical.  Also I do want to note that your BMI is less than 30.  However still try to eat a balanced healthy diet.  Follow-up in a month for complete physical.  You had mentioned you want your insurance to come out and do initial evaluation first.  Waiting a little bit  would be okay but would not recommend waiting past month.  Ines Warf, Ramon Dredge, PA-C

## 2017-01-09 ENCOUNTER — Telehealth: Payer: Self-pay | Admitting: Medical

## 2017-01-09 NOTE — Telephone Encounter (Signed)
Order and last office note faxed and patient notified.

## 2017-01-09 NOTE — Telephone Encounter (Signed)
Copied from CRM [phone]. Topic: Referral - Question >> Jan 09, 2017  1:58 PM Cecelia ByarsGreen, Gailya Tauer L, RMA wrote: Reason for CRM: patient is requesting a change in referral she would like referral to be reassigned to another ENT not associated with [facility], but she wants [facility]

## 2017-01-10 LAB — CULTURE, GROUP A STREP
MICRO NUMBER: 90003275
SPECIMEN QUALITY:: ADEQUATE

## 2017-01-13 ENCOUNTER — Other Ambulatory Visit: Payer: Self-pay | Admitting: Medical

## 2017-01-14 ENCOUNTER — Telehealth: Payer: Self-pay | Admitting: Medical

## 2017-01-14 ENCOUNTER — Telehealth (INDEPENDENT_AMBULATORY_CARE_PROVIDER_SITE_OTHER): Payer: Self-pay

## 2017-01-14 MED ORDER — IBUPROFEN 200 MG PO TABS: ORAL_TABLET | ORAL | 0 refills | Status: DC

## 2017-01-14 NOTE — Telephone Encounter (Signed)
Pt requesting refill on ibuprofen 800mg   Please advise.

## 2017-01-14 NOTE — Telephone Encounter (Signed)
At pt request rx ibuprofen sent to pharmacy.

## 2017-01-14 NOTE — Telephone Encounter (Signed)
[facility]. May have to call patient and explain ENT affilliation. Does [facility] any E[facility]? See CRM Sent by Ulyses Jarredemeka Green.

## 2017-01-14 NOTE — Telephone Encounter (Signed)
Shante from [facility] called stated that they are getting referral for patient for P.T. With no signed orders. Per her request I faxed order to her [phone] for this.

## 2017-01-20 ENCOUNTER — Telehealth: Payer: Self-pay | Admitting: Medical

## 2017-01-20 ENCOUNTER — Telehealth: Payer: Self-pay

## 2017-01-20 NOTE — Telephone Encounter (Signed)
Copied from CRM [phone]. Topic: Referral - Question >> Jan 20, 2017  2:03 PM Crist InfanteHarrald, Kathy J wrote: Reason for CRM: pt insists she needs to speak with referral coordinator concerning her referral to ENT. Declined to elaborate

## 2017-01-20 NOTE — Telephone Encounter (Signed)
[facility] [address] in [phone]) calling patient is requesting 800mg  ibuprofen. Please advise

## 2017-01-20 NOTE — Telephone Encounter (Signed)
Copied from CRM [phone] [phone]. Topic: Quick Communication - Rx Refill/Question >> Jan 20, 2017  1:59 PM Crist InfanteHarrald, Kathy J wrote: Medication: ibuprofen (ADVIL,MOTRIN) 200 MG tablet  Pt states she has called several times and we keep sending the wrong mg in for the advil. Pt states please send in 800 mg, (not 200mg )  ibuprofen (ADVIL,MOTRIN) 800 MG tablet  [id number] [place] - [address] [phone] (Phone) [phone] (Fax)   Pt states she really needs this for her pain.

## 2017-01-21 ENCOUNTER — Telehealth: Payer: Self-pay | Admitting: Medical

## 2017-01-21 MED ORDER — IBUPROFEN 800 MG PO TABS: 800.0000 mg | ORAL_TABLET | Freq: Three times a day (TID) | ORAL | 1 refills | Status: DC | PRN

## 2017-01-21 NOTE — Telephone Encounter (Signed)
Prescribed the 800 mg ibuprofen dose.

## 2017-01-21 NOTE — Telephone Encounter (Signed)
Please advise 

## 2017-01-22 NOTE — Telephone Encounter (Signed)
Pt states she has appointment next week

## 2017-01-23 NOTE — Telephone Encounter (Signed)
Patient call this afternoon stating that [facility] [address] [place].  She did not have the fax number for them.  Her CB#[phone].  Thank you.

## 2017-01-24 NOTE — Telephone Encounter (Signed)
I have tried calling patient several times and line is busy. I did fax an order over for patient 10 days prior per notes in chart. Not sure if patient is requesting something different or in addition to what was already done.

## 2017-02-07 NOTE — Progress Notes (Addendum)
Subjective:   Tori Dattilio is a 72 y.o. female who presents for an Initial Medicare Annual Wellness Visit. The Patient was informed that the wellness visit is to identify future health risk and educate and initiate measures that can reduce risk for increased disease through the lifespan.   Describes health as fair, good or great? Good  Review of Systems   No ROS.  Medicare Wellness Visit. Additional risk factors are reflected in the social history. Cardiac Risk Factors include: advanced age (>9men, >4 women) Sleep patterns: Sleep varies.  Home Safety/Smoke Alarms: Feels safe in home. Smoke alarms in place.  Living environment; residence and Firearm Safety: Lives alone in 3rd floor apt. No issues with stairs.  Seat Belt Safety/Bike Helmet: Wears seat belt.   Female:   Pap- hysterectomy.       Mammo-  Last 08/07/16-normal.      Dexa scan- declines.       [facility]- pt reports last  10/08/15 w 2 polyps. Recall 10 yrs.  Objective:    Today's Vitals   02/13/17 1336  BP: (!) 120/58  Pulse: 65  SpO2: 98%  Weight: 138 lb 12.8 oz (63 kg)  Height: 5\' 5"  (1.651 m)   Body mass index is 23.1 kg/m.  Advanced Directives 02/13/2017  Does Patient Have a Medical Advance Directive? No  Would patient like information on creating a medical advance directive? No - Patient declined    Current Medications (verified) Outpatient Encounter Medications as of 02/13/2017  Medication Sig  . aspirin EC 81 MG tablet Take by mouth daily.  . fluticasone (FLONASE) 50 MCG/ACT nasal spray USE 2 SPRAYS BY NASAL ROUTE DAILY.  Marland Kitchen gabapentin (NEURONTIN) 800 MG tablet TAKE 1 TABLET IN THE MORNING AND TAKE 2 TABLETS AT BEDTIME  . ibuprofen (ADVIL,MOTRIN) 800 MG tablet Take 1 tablet (800 mg total) by mouth every 8 (eight) hours as needed.  . pantoprazole (PROTONIX) 40 MG tablet    No facility-administered encounter medications on file as of 02/13/2017.     Allergies (verified) Tape and Tuberculin ppd   History: Past  Medical History:  Diagnosis Date  . Back pain   . Cataract   . Depression   . Depression   . GERD (gastroesophageal reflux disease)   . Hepatitis C 12/07/2016  . Hyperlipidemia   . Radicular pain   . Sjogren's disease [facility])    Past Surgical History:  Procedure Laterality Date  . CHOLECYSTECTOMY     Family History  Problem Relation Age of Onset  . Cancer Mother    Social History   Socioeconomic History  . Marital status: Single    Spouse name: None  . Number of children: None  . Years of education: None  . Highest education level: None  Social Needs  . Financial resource strain: None  . Food insecurity - worry: None  . Food insecurity - inability: None  . Transportation needs - medical: None  . Transportation needs - non-medical: None  Occupational History  . None  Tobacco Use  . Smoking status: Never Smoker  . Smokeless tobacco: Never Used  Substance and Sexual Activity  . Alcohol use: No  . Drug use: No  . Sexual activity: No  Other Topics Concern  . None  Social History Narrative  . None    Tobacco Counseling Counseling given: Not Answered   Clinical Intake: Pain : No/denies pain   Activities of Daily Living In your present state of health, do you have any difficulty  performing the following activities: 02/13/2017  Hearing? N  Vision? N  Comment wears reading glasses. hx cataract sx on left eye  Difficulty concentrating or making decisions? N  Walking or climbing stairs? N  Dressing or bathing? N  Doing errands, shopping? N  Preparing Food and eating ? N  Using the Toilet? N  In the past six months, have you accidently leaked urine? N  Do you have problems with loss of bowel control? N  Managing your Medications? N  Managing your Finances? N  Housekeeping or managing your Housekeeping? N  Some recent data might be hidden     Immunizations and Health Maintenance  There is no immunization history on file for this patient. Health  Maintenance Due  Topic Date Due  . Hepatitis C Screening  12/25/1945  . TETANUS/TDAP  06/14/1966  . MAMMOGRAM  06/13/1997  . COLONOSCOPY  06/13/1997  . DEXA SCAN  06/13/2012  . PNA vac Low Risk Adult (1 of 2 - PCV13) 06/13/2012  . INFLUENZA VACCINE  08/07/2016    Patient Care Team: Saguier, Kateri Mc as PCP - General (Internal Medicine)  Indicate any recent Medical Services you may have received from other than [facility] providers in the past year (date may be approximate).     Assessment:   This is a routine wellness examination for [place]. Physical assessment deferred to PCP.  Hearing/Vision screen Hearing Screening Comments: Able to hear conversational tones w/o difficulty. No issues reported.   Vision Screening Comments: Wears glasses only for reading. Reports getting yearly eye exams.  Dietary issues and exercise activities discussed: Current Exercise Habits: Home exercise routine, Type of exercise: walking, Time (Minutes): 40, Frequency (Times/Week): 5, Weekly Exercise (Minutes/Week): 200, Intensity: Mild, Exercise limited by: None identified(back pain) Diet (meal preparation, eat out, water intake, caffeinated beverages, dairy products, fruits and vegetables): in general, a "healthy" diet  , well balanced  Drinks plenty of water.    Goals    . 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      Depression Screen PHQ 2/9 Scores 02/13/2017 08/21/2016  PHQ - 2 Score 0 3  PHQ- 9 Score - 10    Fall Risk Fall Risk  02/13/2017 08/21/2016 08/21/2016  Falls in the past year? No No No     Cognitive Function: MMSE - Mini Mental State Exam 02/13/2017  Orientation to time 5  Orientation to Place 5  Registration 3  Attention/ Calculation 5  Recall 1  Language- name 2 objects 2  Language- repeat 1  Language- follow 3 step command 3  Language- read & follow direction 1  Write a sentence 1  Copy design 1  Total score 28        Screening Tests Health Maintenance  Topic Date  Due  . Hepatitis C Screening  1945-07-09  . TETANUS/TDAP  06/14/1966  . MAMMOGRAM  06/13/1997  . COLONOSCOPY  06/13/1997  . DEXA SCAN  06/13/2012  . PNA vac Low Risk Adult (1 of 2 - PCV13) 06/13/2012  . INFLUENZA VACCINE  08/07/2016        Plan:   Schedule appointment to follow up with Esperanza Richters, PA and discuss low dose CT of lung as you discussed with the nurse that came to your house.  Continue to eat heart healthy diet (full of fruits, vegetables, whole grains, lean protein, water--limit salt, fat, and sugar intake) and increase physical activity as tolerated.  Continue doing brain stimulating activities (puzzles, reading, adult coloring books, staying  active) to keep memory sharp.    I have personally reviewed and noted the following in the patient's chart:   . Medical and social history . Use of alcohol, tobacco or illicit drugs  . Current medications and supplements . Functional ability and status . Nutritional status . Physical activity . Advanced directives . List of other physicians . Hospitalizations, surgeries, and ER visits in previous 12 months . Vitals . Screenings to include cognitive, depression, and falls . Referrals and appointments  In addition, I have reviewed and discussed with patient certain preventive protocols, quality metrics, and best practice recommendations. A written personalized care plan for preventive services as well as general preventive health recommendations were provided to patient.     Avon GullyBritt, Coriann Brouhard Angel, [place]   02/13/2017   I have reviewed and agree with plan/assessment of RN.  Esperanza RichtersEdward Saguier, PA-C

## 2017-02-13 ENCOUNTER — Ambulatory Visit (INDEPENDENT_AMBULATORY_CARE_PROVIDER_SITE_OTHER): Payer: 59 | Admitting: *Deleted

## 2017-02-13 ENCOUNTER — Encounter: Payer: Self-pay | Admitting: *Deleted

## 2017-02-13 VITALS — BP 120/58 | HR 65 | Ht 65.0 in | Wt 138.8 lb

## 2017-02-13 DIAGNOSIS — Z0000 Encounter for general adult medical examination without abnormal findings: Secondary | ICD-10-CM | POA: Diagnosis not present

## 2017-02-13 NOTE — Patient Instructions (Signed)
Schedule appointment to follow up with Latoya Pai, PA and discuss low dose CT of lung as you discussed with the nurse that came to your house.  Continue to eat heart healthy diet (full of fruits, vegetables, whole grains, lean protein, water--limit salt, fat, and sugar intake) and increase physical activity as tolerated.  Continue doing brain stimulating activities (puzzles, reading, adult coloring books, staying active) to keep memory sharp.    Latoya Cummings , Thank you for taking time to come for your Medicare Wellness Visit. I appreciate your ongoing commitment to your health goals. Please review the following plan we discussed and let me know if I can assist you in the future.   These are the goals we discussed: Goals    . 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       This is a list of the screening recommended for you and due dates:  Health Maintenance  Topic Date Due  .  Hepatitis C: One time screening is recommended by Center for Disease Control  (CDC) for  adults born from [birth year] through 1963.   [birth year]  . Tetanus Vaccine  06/14/1966  . Mammogram  06/13/1997  . Colon Cancer Screening  06/13/1997  . DEXA scan (bone density measurement)  06/13/2012  . Pneumonia vaccines (1 of 2 - PCV13) 06/13/2012  . Flu Shot  08/07/2016     Health Maintenance for Postmenopausal Women Menopause is a normal process in which your reproductive ability comes to an end. This process happens gradually over a span of months to years, usually between the ages of 8 and 90. Menopause is complete when you have missed 12 consecutive menstrual periods. It is important to talk with your health care provider about some of the most common conditions that affect postmenopausal women, such as heart disease, cancer, and bone loss (osteoporosis). Adopting a healthy lifestyle and getting preventive care can help to promote your health and wellness. Those actions can also lower your chances of developing some  of these common conditions. What should I know about menopause? During menopause, you may experience a number of symptoms, such as:  Moderate-to-severe hot flashes.  Night sweats.  Decrease in sex drive.  Mood swings.  Headaches.  Tiredness.  Irritability.  Memory problems.  Insomnia.  Choosing to treat or not to treat menopausal changes is an individual decision that you make with your health care provider. What should I know about hormone replacement therapy and supplements? Hormone therapy products are effective for treating symptoms that are associated with menopause, such as hot flashes and night sweats. Hormone replacement carries certain risks, especially as you become older. If you are thinking about using estrogen or estrogen with progestin treatments, discuss the benefits and risks with your health care provider. What should I know about heart disease and stroke? Heart disease, heart attack, and stroke become more likely as you age. This may be due, in part, to the hormonal changes that your body experiences during menopause. These can affect how your body processes dietary fats, triglycerides, and cholesterol. Heart attack and stroke are both medical emergencies. There are many things that you can do to help prevent heart disease and stroke:  Have your blood pressure checked at least every 1-2 years. High blood pressure causes heart disease and increases the risk of stroke.  If you are 52-52 years old, ask your health care provider if you should take aspirin to prevent a heart attack or a stroke.  Do not use  any tobacco products, including cigarettes, chewing tobacco, or electronic cigarettes. If you need help quitting, ask your health care provider.  It is important to eat a healthy diet and maintain a healthy weight. ? Be sure to include plenty of vegetables, fruits, low-fat dairy products, and lean protein. ? Avoid eating foods that are high in solid fats, added  sugars, or salt (sodium).  Get regular exercise. This is one of the most important things that you can do for your health. ? Try to exercise for at least 150 minutes each week. The type of exercise that you do should increase your heart rate and make you sweat. This is known as moderate-intensity exercise. ? Try to do strengthening exercises at least twice each week. Do these in addition to the moderate-intensity exercise.  Know your numbers.Ask your health care provider to check your cholesterol and your blood glucose. Continue to have your blood tested as directed by your health care provider.  What should I know about cancer screening? There are several types of cancer. Take the following steps to reduce your risk and to catch any cancer development as early as possible. Breast Cancer  Practice breast self-awareness. ? This means understanding how your breasts normally appear and feel. ? It also means doing regular breast self-exams. Let your health care provider know about any changes, no matter how small.  If you are 45 or older, have a clinician do a breast exam (clinical breast exam or CBE) every year. Depending on your age, family history, and medical history, it may be recommended that you also have a yearly breast X-ray (mammogram).  If you have a family history of breast cancer, talk with your health care provider about genetic screening.  If you are at high risk for breast cancer, talk with your health care provider about having an MRI and a mammogram every year.  Breast cancer (BRCA) gene test is recommended for women who have family members with BRCA-related cancers. Results of the assessment will determine the need for genetic counseling and BRCA1 and for BRCA2 testing. BRCA-related cancers include these types: ? Breast. This occurs in males or females. ? Ovarian. ? Tubal. This may also be called fallopian tube cancer. ? Cancer of the abdominal or pelvic lining (peritoneal  cancer). ? Prostate. ? Pancreatic.  Cervical, Uterine, and Ovarian Cancer Your health care provider may recommend that you be screened regularly for cancer of the pelvic organs. These include your ovaries, uterus, and vagina. This screening involves a pelvic exam, which includes checking for microscopic changes to the surface of your cervix (Pap test).  For women ages 21-65, health care providers may recommend a pelvic exam and a Pap test every three years. For women ages 77-65, they may recommend the Pap test and pelvic exam, combined with testing for human papilloma virus (HPV), every five years. Some types of HPV increase your risk of cervical cancer. Testing for HPV may also be done on women of any age who have unclear Pap test results.  Other health care providers may not recommend any screening for nonpregnant women who are considered low risk for pelvic cancer and have no symptoms. Ask your health care provider if a screening pelvic exam is right for you.  If you have had past treatment for cervical cancer or a condition that could lead to cancer, you need Pap tests and screening for cancer for at least 20 years after your treatment. If Pap tests have been discontinued for you, your  risk factors (such as having a new sexual partner) need to be reassessed to determine if you should start having screenings again. Some women have medical problems that increase the chance of getting cervical cancer. In these cases, your health care provider may recommend that you have screening and Pap tests more often.  If you have a family history of uterine cancer or ovarian cancer, talk with your health care provider about genetic screening.  If you have vaginal bleeding after reaching menopause, tell your health care provider.  There are currently no reliable tests available to screen for ovarian cancer.  Lung Cancer Lung cancer screening is recommended for adults 88-3 years old who are at high risk for  lung cancer because of a history of smoking. A yearly low-dose CT scan of the lungs is recommended if you:  Currently smoke.  Have a history of at least 30 pack-years of smoking and you currently smoke or have quit within the past 15 years. A pack-year is smoking an average of one pack of cigarettes per day for one year.  Yearly screening should:  Continue until it has been 15 years since you quit.  Stop if you develop a health problem that would prevent you from having lung cancer treatment.  Colorectal Cancer  This type of cancer can be detected and can often be prevented.  Routine colorectal cancer screening usually begins at age 30 and continues through age 32.  If you have risk factors for colon cancer, your health care provider may recommend that you be screened at an earlier age.  If you have a family history of colorectal cancer, talk with your health care provider about genetic screening.  Your health care provider may also recommend using home test kits to check for hidden blood in your stool.  A small camera at the end of a tube can be used to examine your colon directly (sigmoidoscopy or colonoscopy). This is done to check for the earliest forms of colorectal cancer.  Direct examination of the colon should be repeated every 5-10 years until age 67. However, if early forms of precancerous polyps or small growths are found or if you have a family history or genetic risk for colorectal cancer, you may need to be screened more often.  Skin Cancer  Check your skin from head to toe regularly.  Monitor any moles. Be sure to tell your health care provider: ? About any new moles or changes in moles, especially if there is a change in a mole's shape or color. ? If you have a mole that is larger than the size of a pencil eraser.  If any of your family members has a history of skin cancer, especially at a young age, talk with your health care provider about genetic  screening.  Always use sunscreen. Apply sunscreen liberally and repeatedly throughout the day.  Whenever you are outside, protect yourself by wearing long sleeves, pants, a wide-brimmed hat, and sunglasses.  What should I know about osteoporosis? Osteoporosis is a condition in which bone destruction happens more quickly than new bone creation. After menopause, you may be at an increased risk for osteoporosis. To help prevent osteoporosis or the bone fractures that can happen because of osteoporosis, the following is recommended:  If you are 67-39 years old, get at least 1,000 mg of calcium and at least 600 mg of vitamin D per day.  If you are older than age 40 but younger than age 49, get at least 26,200  mg of calcium and at least 600 mg of vitamin D per day.  If you are older than age 107, get at least 1,200 mg of calcium and at least 800 mg of vitamin D per day.  Smoking and excessive alcohol intake increase the risk of osteoporosis. Eat foods that are rich in calcium and vitamin D, and do weight-bearing exercises several times each week as directed by your health care provider. What should I know about how menopause affects my mental health? Depression may occur at any age, but it is more common as you become older. Common symptoms of depression include:  Low or sad mood.  Changes in sleep patterns.  Changes in appetite or eating patterns.  Feeling an overall lack of motivation or enjoyment of activities that you previously enjoyed.  Frequent crying spells.  Talk with your health care provider if you think that you are experiencing depression. What should I know about immunizations? It is important that you get and maintain your immunizations. These include:  Tetanus, diphtheria, and pertussis (Tdap) booster vaccine.  Influenza every year before the flu season begins.  Pneumonia vaccine.  Shingles vaccine.  Your health care provider may also recommend other  immunizations. This information is not intended to replace advice given to you by your health care provider. Make sure you discuss any questions you have with your health care provider. Document Released: 02/15/2005 Document Revised: 07/14/2015 Document Reviewed: 09/27/2014 Elsevier Interactive Patient Education  2018 [employer].

## 2017-03-17 ENCOUNTER — Telehealth (INDEPENDENT_AMBULATORY_CARE_PROVIDER_SITE_OTHER): Payer: Self-pay | Admitting: Orthopedic Surgery

## 2017-03-17 NOTE — Telephone Encounter (Signed)
Patient called asked for a call back concerning her appointment to have dry needleling tomorrow. Patient advised she has some questions before she have the procedure done. Patient asked for a call back as soon as possible.  The number to contact patient is [phone]

## 2017-03-17 NOTE — Telephone Encounter (Signed)
IC s/w patient.  Advised we were not familiar with process of how dry needling works.

## 2017-03-24 ENCOUNTER — Telehealth: Payer: Self-pay | Admitting: Medical

## 2017-03-24 ENCOUNTER — Telehealth: Payer: Self-pay | Admitting: *Deleted

## 2017-03-24 DIAGNOSIS — G8929 Other chronic pain: Secondary | ICD-10-CM

## 2017-03-24 DIAGNOSIS — M545 Low back pain: Principal | ICD-10-CM

## 2017-03-24 NOTE — Telephone Encounter (Signed)
Patient notified that referral has been placed.  She will call and make an appt to be seen next week.

## 2017-03-24 NOTE — Telephone Encounter (Signed)
[place] pain management referral. [place]

## 2017-03-24 NOTE — Telephone Encounter (Signed)
Copied from CRM [id number]. 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 Would like a call back    [phone]    [facility]   [phone]

## 2017-03-24 NOTE — Telephone Encounter (Signed)
I have seen patient 1 time in August for her back pain.  Did try to refer her to orthopedic specialist and physical medicine.  Eventually it appears on chart review that we were successful in getting her to see physical medicine in October and they evaluated her lumbar radiculopathy and did a procedure.    I did also just place referral to [facility].   I would recommend in for recheck on her back pain has I have not seen her regarding this for 7 months.  Would you call pt for me.

## 2017-04-03 ENCOUNTER — Other Ambulatory Visit: Payer: Self-pay | Admitting: Medical

## 2017-04-03 ENCOUNTER — Ambulatory Visit: Payer: 59 | Admitting: Family Medicine

## 2017-04-15 ENCOUNTER — Encounter: Payer: Self-pay | Admitting: Obstetrics and Gynecology

## 2017-04-23 ENCOUNTER — Encounter: Payer: Self-pay | Admitting: Osteopathic Medicine

## 2017-04-23 ENCOUNTER — Ambulatory Visit (INDEPENDENT_AMBULATORY_CARE_PROVIDER_SITE_OTHER): Payer: 59 | Admitting: Osteopathic Medicine

## 2017-04-23 DIAGNOSIS — M3501 Sicca syndrome with keratoconjunctivitis: Secondary | ICD-10-CM | POA: Diagnosis not present

## 2017-04-23 DIAGNOSIS — G459 Transient cerebral ischemic attack, unspecified: Secondary | ICD-10-CM

## 2017-04-23 DIAGNOSIS — M545 Low back pain: Secondary | ICD-10-CM

## 2017-04-23 DIAGNOSIS — G629 Polyneuropathy, unspecified: Secondary | ICD-10-CM | POA: Diagnosis not present

## 2017-04-23 DIAGNOSIS — G8929 Other chronic pain: Secondary | ICD-10-CM

## 2017-04-23 NOTE — Progress Notes (Signed)
HPI: Latoya Cummings is a 72 y.o. female who  has a past medical history of Back pain, Cataract, Depression, Depression, GERD (gastroesophageal reflux disease), Hepatitis C (12/07/2016), Hyperlipidemia, Radicular pain, and Sjogren's disease (HCC).  she presents to [facility] today, 04/23/17,  for chief complaint of: Establish Care  Pleasant patient. New to establish care. Medical issues as noted below.   Chronic pain: Following with [facility]: last visit 04/16/17, reviewed notes. "ASSESSMENT 1. Chronic SI joint pain 2. Sjogren's syndrome, with unspecified organ involvement (*) 3. Pain syndrome, chronic 4. Chronic buttock pain 5. Abdominal pain, unspecified abdominal location 6. Chronic bilateral low back pain without sciatica 7. Facial pain. Drug monitoring NCCSRS database was reviewed and is appropriate. PLAN 1. Will schedule patient for right SI Joint injection to be performed in C-arm. 2. Re-start Gabapentin 300 mg TID, I will up titrate the dose as tolerated. 3. Re-start Norco 5 mg BID 1 script provided #60. 4. Continue to follow with Rheumatology. 5. RTC for procedure" She is not forthcoming about the opiates...   Sjogren's: was following with Rheumatology, has been awhile since she saw this person, 10/2016. Has dome dry eye issues   Hep C: following with GI. Thinks she has been treated and cured.       Past medical, surgical, social and family history reviewed:  Patient Active Problem List   Diagnosis Date Noted  . 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 Dermatochalasis of both upper eyelids 06/29/2015  . 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 Sjogren's disease (HCC) 11/11/2014  . Panic disorder 02/08/2013  . Chronic pain syndrome 11/09/2011  . Neuropathic pain of lower extremity 11/09/2011  . Back pain with radiation 07/05/2011  . Lichenification and lichen simplex chronicus 12/27/2010  .  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 Neuropathy 12/27/2010  . History of hysterectomy with oophorectomy 11/27/2010  . Pain in joints 11/27/2010  . Personality disorder (HCC) 10/22/2010  . Tobacco use disorder 10/22/2010  . Esophageal reflux 06/09/2009  . Major depressive disorder, single episode 12/13/2008  . Transient cerebral ischemia 11/18/2008    Past Surgical History:  Procedure Laterality Date  . CHOLECYSTECTOMY      Social History   Tobacco Use  . Smoking status: Never Smoker  . Smokeless tobacco: Never Used  Substance Use Topics  . Alcohol use: No    Family History  Problem Relation Age of Onset  . Cancer Mother      Current medication list and allergy/intolerance information reviewed:    Current Outpatient Medications  Medication Sig Dispense Refill  . fluticasone (FLONASE) 50 MCG/ACT nasal spray USE 2 SPRAYS BY NASAL ROUTE DAILY. 16 g 2  . gabapentin (NEURONTIN) 800 MG tablet TAKE 1 TABLET IN THE MORNING AND TAKE 2 TABLETS AT BEDTIME 270 tablet 0  . GINKGO BILOBA PO Take by mouth.    Marland Kitchen HYDROcodone-acetaminophen (NORCO/VICODIN) 5-325 MG tablet Take by mouth.    Marland Kitchen ibuprofen (ADVIL,MOTRIN) 800 MG tablet Take 1 tablet (800 mg total) by mouth every 8 (eight) hours as needed. 30 tablet 1  . Omega-3 Fatty Acids (FISH OIL PO) Take by mouth.    . pantoprazole (PROTONIX) 40 MG tablet      No current facility-administered medications for this visit.     Allergies  Allergen Reactions  . Tape Rash  . Tuberculin Ppd Rash      Review of Systems:  Constitutional:  No  fever, no chills,  No recent illness  HEENT: No  headache, no vision change  Cardiac: No  chest pain, No  pressure, No palpitations  Respiratory:  No  shortness of breath. No  Cough  Gastrointestinal: No  abdominal pain, No  nausea, No  vomiting  Musculoskeletal: No new myalgia/arthralgia  Skin: No  Rash, dry skin   Neurologic: No  weakness, No  dizziness  Psychiatric: No  concerns with  depression, No  concerns with anxiety  Exam:  BP 123/69 (BP Location: Right Arm, Patient Position: Sitting, Cuff Size: Normal)   Pulse 64   Temp 98 F (36.7 C) (Oral)   Wt 135 lb 8 oz (61.5 kg)   BMI 22.55 kg/m   Constitutional: VS see above. General Appearance: alert, well-developed, well-nourished, NAD  Eyes: Normal lids and conjunctive, non-icteric sclera  Ears, Nose, Mouth, Throat: MMM, Normal external inspection ears/nares/mouth/lips/gums. TM normal bilaterally. Pharynx/tonsils no erythema, no exudate. Nasal mucosa normal.   Neck: No masses, trachea midline. No thyroid enlargement. No tenderness/mass appreciated. No lymphadenopathy  Respiratory: Normal respiratory effort. no wheeze, no rhonchi, no rales  Cardiovascular: S1/S2 normal, no murmur, no rub/gallop auscultated. RRR. No lower extremity edema.   Gastrointestinal: Nontender, no masses. No hepatomegaly, no splenomegaly. No hernia appreciated. Bowel sounds normal. Rectal exam deferred.   Musculoskeletal: Gait normal. No clubbing/cyanosis of digits.   Neurological: Normal balance/coordination. No tremor.  Skin: warm, dry, intact. No rash/ulcer. No concerning nevi or subq nodules on limited exam.    Psychiatric: Normal judgment/insight. Normal mood and affect. Oriented x3.      ASSESSMENT/PLAN: chronic medical conditions largely stable, concerned about dry mouth/eye likely Sjogren related and I advised reestablish with Rheumatology.   Chronic low back pain, unspecified back pain laterality, with sciatica presence unspecified - following with pain management  Neuropathy - following with pain management   Transient cerebral ischemia, unspecified type - no concerning deficits  Sjogren's syndrome with keratoconjunctivitis sicca (HCC) - advised follow up with rheumatology      Visit summary with medication list and pertinent instructions was printed for patient to review. All questions at time of visit were  answered - patient instructed to contact office with any additional concerns. ER/RTC precautions were reviewed with the patient.   Follow-up plan: Return in about 6 months (around 10/23/2017) for annual physical and preventive care review .  Note: Total time spent 30 minutes, greater than 50% of the visit was spent face-to-face counseling and coordinating care for the following: Diagnoses of Chronic low back pain, unspecified back pain laterality, with sciatica presence unspecified, Neuropathy, Transient cerebral ischemia, unspecified type, and Sjogren's syndrome with keratoconjunctivitis sicca (HCC) were pertinent to this visit.Marland Kitchen.  Please note: voice recognition software was used to produce this document, and typos may escape review. Please contact Dr. Lyn HollingsheadAlexander for any needed clarifications.

## 2017-05-21 ENCOUNTER — Telehealth (INDEPENDENT_AMBULATORY_CARE_PROVIDER_SITE_OTHER): Payer: Self-pay | Admitting: Physical Medicine and Rehabilitation

## 2017-05-21 NOTE — Telephone Encounter (Signed)
Yes if criteria

## 2017-05-22 NOTE — Telephone Encounter (Signed)
Pt states no new major injuries. Pt states pain is in lower back that radiates into the right side and right groin.

## 2017-05-22 NOTE — Telephone Encounter (Signed)
Can you call this patient this morning to discuss/ schedule? 

## 2017-06-11 ENCOUNTER — Encounter (INDEPENDENT_AMBULATORY_CARE_PROVIDER_SITE_OTHER): Payer: Self-pay | Admitting: Physical Medicine and Rehabilitation

## 2017-06-19 ENCOUNTER — Telehealth: Payer: Self-pay

## 2017-06-19 DIAGNOSIS — M545 Low back pain: Principal | ICD-10-CM

## 2017-06-19 DIAGNOSIS — G8929 Other chronic pain: Secondary | ICD-10-CM

## 2017-06-19 NOTE — Telephone Encounter (Signed)
Pt called requesting a referral [place] [facility] [place] area. Pls advise, thanks.

## 2017-06-19 NOTE — Telephone Encounter (Signed)
Order placed

## 2017-06-20 NOTE — Telephone Encounter (Signed)
Pt has been updated.  

## 2017-07-08 ENCOUNTER — Other Ambulatory Visit: Payer: Self-pay | Admitting: Medical

## 2017-07-08 NOTE — Telephone Encounter (Signed)
Pt due for follow up please call and schedule appointment.  

## 2017-07-08 NOTE — Telephone Encounter (Signed)
Spoke with pt and informed her the below, pt states was having treatment today at another [facility] and that will call to schedule her fu appt later today.

## 2017-07-11 ENCOUNTER — Other Ambulatory Visit: Payer: Self-pay

## 2017-07-11 MED ORDER — GABAPENTIN 800 MG PO TABS: ORAL_TABLET | ORAL | 0 refills | Status: DC

## 2017-09-10 DIAGNOSIS — M9903 Segmental and somatic dysfunction of lumbar region: Secondary | ICD-10-CM | POA: Diagnosis not present

## 2017-09-10 DIAGNOSIS — M6240 Contracture of muscle, unspecified site: Secondary | ICD-10-CM | POA: Diagnosis not present

## 2017-09-10 DIAGNOSIS — Z23 Encounter for immunization: Secondary | ICD-10-CM | POA: Diagnosis not present

## 2017-09-29 ENCOUNTER — Other Ambulatory Visit: Payer: Self-pay | Admitting: Physician Assistant

## 2017-09-29 ENCOUNTER — Telehealth: Payer: Self-pay

## 2017-09-29 DIAGNOSIS — M3501 Sicca syndrome with keratoconjunctivitis: Secondary | ICD-10-CM

## 2017-09-29 NOTE — Telephone Encounter (Signed)
Pt has been updated.  

## 2017-09-29 NOTE — Telephone Encounter (Signed)
Pt called requesting referrals for Rheumatologist and opthalmologist. Thanks.

## 2017-09-29 NOTE — Telephone Encounter (Signed)
Referrals placed 

## 2017-09-30 ENCOUNTER — Telehealth: Payer: Self-pay

## 2017-09-30 NOTE — Telephone Encounter (Signed)
Pt has been updated of covering provider's note. Agrees with recommendation, transferred to FD for scheduling.

## 2017-09-30 NOTE — Telephone Encounter (Signed)
Pt called requesting a x-ray referral of the spinal area. As per pt, had one done years ago. Would like to have an updated report. She has an upcoming appt to be evaluated by a chiropractor.

## 2017-09-30 NOTE — Telephone Encounter (Signed)
It looks like she had a pelvis x-ray but not a low back x-ray. This is effectively a new issue.  I would recommend a follow-up visit to reevaluate this.  Recommend patient schedule an appointment.

## 2017-10-02 ENCOUNTER — Ambulatory Visit: Payer: Self-pay | Admitting: Family Medicine

## 2017-10-03 NOTE — Progress Notes (Signed)
Office Visit Note  Patient: Latoya Cummings             Date of Birth: 03/08/1945           MRN: [id number]             PCP: Sunnie Nielsen, DO Referring: Donzetta Kohut* Visit Date: 10/15/2017 Occupation: Retired bus driver  Subjective:  Dry mouth and Raynauds  History of Present Illness: Latoya Cummings is a 72 y.o. female seen in consultation per request of her PCP.  According to patient she has been experiencing neuropathy in her extremities for a long time.  She used to see a neurologist at [facility] who retired.  She states she was placed on gabapentin for approximately 5 years.  She has also noticed change in coloration of her hands and feet for about 4 months.  Her hands and feet just a cold.  She also gives history of dry mouth and dry eyes.  She has been using Biotene products.  She has some discomfort in her right knee joint.  She denies any history of joint swelling.  Activities of Daily Living:  Patient reports morning stiffness for 0  minute.   Patient Denies nocturnal pain.  Difficulty dressing/grooming: Denies Difficulty climbing stairs: Reports Difficulty getting out of chair: Reports Difficulty using hands for taps, buttons, cutlery, and/or writing: Denies  Review of Systems  Constitutional: Positive for fatigue.  HENT: Positive for mouth dryness. Negative for mouth sores and nose dryness.   Eyes: Positive for dryness. Negative for pain and visual disturbance.  Respiratory: Negative for cough, hemoptysis, shortness of breath and difficulty breathing.   Cardiovascular: Negative for chest pain, palpitations, hypertension and swelling in legs/feet.  Gastrointestinal: Positive for constipation. Negative for blood in stool and diarrhea.  Endocrine: Negative for increased urination.  Genitourinary: Negative for painful urination.  Musculoskeletal: Positive for arthralgias and joint pain. Negative for joint swelling, myalgias, muscle weakness, morning stiffness,  muscle tenderness and myalgias.  Skin: Positive for color change and rash. Negative for pallor, hair loss, nodules/bumps, skin tightness, ulcers and sensitivity to sunlight.  Allergic/Immunologic: Negative for susceptible to infections.  Neurological: Negative for dizziness, numbness, headaches and weakness.  Hematological: Negative for swollen glands.  Psychiatric/Behavioral: Negative for depressed mood and sleep disturbance. The patient is not nervous/anxious.     PMFS History:  Patient Active Problem List   Diagnosis Date Noted  . 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 Dermatochalasis of both upper eyelids 06/29/2015  . 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 Sjogren's disease (HCC) 11/11/2014  . Panic disorder 02/08/2013  . Chronic pain syndrome 11/09/2011  . Neuropathic pain of lower extremity 11/09/2011  . Back pain with radiation 07/05/2011  . Lichenification and lichen simplex chronicus 12/27/2010  . 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 Neuropathy 12/27/2010  . History of hysterectomy with oophorectomy 11/27/2010  . Pain in joints 11/27/2010  . Personality disorder (HCC) 10/22/2010  . Tobacco use disorder 10/22/2010  . Esophageal reflux 06/09/2009  . Major depressive disorder, single episode 12/13/2008  . Transient cerebral ischemia 11/18/2008    Past Medical History:  Diagnosis Date  . Back pain   . Cataract   . Depression   . Depression   . GERD (gastroesophageal reflux disease)   . Hepatitis C 12/07/2016  . Hyperlipidemia   . Radicular pain   . Sjogren's disease (HCC)     Family History  Problem Relation Age of Onset  .  Cancer Mother   . Asthma Brother   . Sickle cell anemia Brother   . Cancer Brother   . Lupus Brother    Past Surgical History:  Procedure Laterality Date  . ABDOMINAL HYSTERECTOMY     Social History   Social History Narrative  . Not on file    Objective: Vital Signs: BP 138/66 (BP Location: Right Arm,  Patient Position: Sitting, Cuff Size: Normal)   Pulse (!) 58   Resp 14   Ht 5\' 5"  (1.651 m)   Wt 134 lb 3.2 oz (60.9 kg)   BMI 22.33 kg/m    Physical Exam  Constitutional: She is oriented to person, place, and time. She appears well-developed and well-nourished.  HENT:  Head: Normocephalic and atraumatic.  Eyes: Conjunctivae and EOM are normal.  Neck: Normal range of motion.  Cardiovascular: Normal rate, regular rhythm, normal heart sounds and intact distal pulses.  Pulmonary/Chest: Effort normal and breath sounds normal.  Abdominal: Soft. Bowel sounds are normal.  Lymphadenopathy:    She has no cervical adenopathy.  Neurological: She is alert and oriented to person, place, and time.  Skin: Skin is warm and dry. Capillary refill takes 2 to 3 seconds.  Psychiatric: She has a normal mood and affect. Her behavior is normal.  Nursing note and vitals reviewed.    Musculoskeletal Exam: C-spine thoracic lumbar spine good range of motion.  Shoulder joints elbow joints wrist joint MCPs PIPs DIPs were in good range of motion with no synovitis.  Hip joints knee joints ankles MTPs PIPs were in good range of motion with no synovitis.  She had discomfort range of motion of bilateral knee joints without any warmth swelling or effusion.  CDAI Exam: CDAI Score: Not documented Patient Global Assessment: Not documented; Provider Global Assessment: Not documented Swollen: Not documented; Tender: Not documented Joint Exam   Not documented   There is currently no information documented on the homunculus. Go to the Rheumatology activity and complete the homunculus joint exam.  Investigation: No additional findings.  Imaging: No results found.  Recent Labs: Lab Results  Component Value Date   NA 141 09/17/2016   K 4.1 09/17/2016   CL 103 09/17/2016   CO2 32 09/17/2016   GLUCOSE 97 09/17/2016   BUN 12 09/17/2016   CREATININE 0.77 09/17/2016   BILITOT 0.5 09/17/2016   ALKPHOS 62  09/17/2016   AST 29 09/17/2016   ALT 16 09/17/2016   PROT 8.1 09/17/2016   ALBUMIN 4.5 09/17/2016   CALCIUM 10.3 09/17/2016    Speciality Comments: No specialty comments available.  Procedures:  No procedures performed Allergies: Aspirin; Moxifloxacin; Tape; and Tuberculin ppd   Assessment / Plan:     Visit Diagnoses: Sicca complex (HCC) -patient complained of sicca symptoms for many years.  She has been using over-the-counter products which has been helpful.  I will obtain following labs today.  Plan: Urinalysis, Routine w reflex microscopic, QuantiFERON-TB Gold Plus, HIV Antibody (routine testing w rflx)  Raynaud's disease without gangrene -she gives history of Raynolds phenomenon for last few months.  She was a smoker in the past but she does not smoke anymore.  No digital ulcers were noted.  I will obtain following labs today.  Plan: Angiotensin converting enzyme, Cryoglobulin, Pan-ANCA, ANA, Anti-scleroderma antibody, RNP Antibody, Anti-Smith antibody, Sjogrens syndrome-A extractable nuclear antibody, Sjogrens syndrome-B extractable nuclear antibody, Anti-DNA antibody, double-stranded, C3 and C4, Beta-2 glycoprotein antibodies, Cardiolipin antibodies, IgG, IgM, IgA, Lupus Anticoagulant Eval w/Reflex  Chronic pain of both  knees -she complains of knee joint discomfort and difficulty with climbing stairs and getting up from the chair.  She was in a hurry today we will get x-rays of her knee joints next visit.  Plan: Rheumatoid factor, Sedimentation rate, Angiotensin converting enzyme  Other fatigue - Plan: CBC with Differential/Platelet, COMPLETE METABOLIC PANEL WITH GFR, CK, TSH, Serum protein electrophoresis with reflex, Thiopurine methyltransferase(tpmt)rbc, Glucose 6 phosphate dehydrogenase  History of neuropathy-according to patient she was followed up by a neurologist at [facility] in the past.  She would like to establish with a neurologist here.  She states she was placed  on gabapentin which has been helpful.  Neurodermatitis  History of hepatitis C-patient reports that she was treated for hepatitis C and will have repeat titers.  Lichenification and lichen simplex chronicus  Dermatochalasis of both upper eyelids  History of TIA (transient ischemic attack)  History of depression  History of personality disorder  History of hyperlipidemia  Chronic pain syndrome  Tobacco use disorder-patient reports that she quit smoking.  History of gastroesophageal reflux (GERD)   Orders: Orders Placed This Encounter  Procedures  . CBC with Differential/Platelet  . COMPLETE METABOLIC PANEL WITH GFR  . Urinalysis, Routine w reflex microscopic  . CK  . TSH  . Rheumatoid factor  . Sedimentation rate  . Angiotensin converting enzyme  . Cryoglobulin  . Pan-ANCA  . ANA  . Anti-scleroderma antibody  . RNP Antibody  . Anti-Smith antibody  . Sjogrens syndrome-A extractable nuclear antibody  . Sjogrens syndrome-B extractable nuclear antibody  . Anti-DNA antibody, double-stranded  . C3 and C4  . Beta-2 glycoprotein antibodies  . Cardiolipin antibodies, IgG, IgM, IgA  . Lupus Anticoagulant Eval w/Reflex  . Serum protein electrophoresis with reflex  . Thiopurine methyltransferase(tpmt)rbc  . QuantiFERON-TB Gold Plus  . HIV Antibody (routine testing w rflx)  . Glucose 6 phosphate dehydrogenase   No orders of the defined types were placed in this encounter.   Face-to-face time spent with patient was 50 minutes. Greater than 50% of time was spent in counseling and coordination of care.  Follow-Up Instructions: Return for sicca, raynauds.   Pollyann Savoy, MD  Note - This record has been created using Animal nutritionist.  Chart creation errors have been sought, but may not always  have been located. Such creation errors do not reflect on  the standard of medical care.

## 2017-10-06 ENCOUNTER — Other Ambulatory Visit: Payer: Self-pay | Admitting: Osteopathic Medicine

## 2017-10-09 ENCOUNTER — Other Ambulatory Visit: Payer: Self-pay | Admitting: Osteopathic Medicine

## 2017-10-09 DIAGNOSIS — Z1231 Encounter for screening mammogram for malignant neoplasm of breast: Secondary | ICD-10-CM | POA: Diagnosis not present

## 2017-10-09 LAB — HM MAMMOGRAPHY

## 2017-10-09 NOTE — Telephone Encounter (Signed)
Requested medication (s) are due for refill today: yes  Requested medication (s) are on the active medication list: yes  Last refill:  10/08/16  Future visit scheduled: no  Notes to clinic:  PCP is listed as Sunnie Nielsen DO      Requested Prescriptions  Pending Prescriptions Disp Refills   fluticasone (FLONASE) 50 MCG/ACT nasal spray [Pharmacy Med Name: FLUTICASONE PROP 50 MCG SPRAY]  0    Sig: USE 2 SPRAYS BY NASAL ROUTE DAILY.     There is no refill protocol information for this order

## 2017-10-10 LAB — HM MAMMOGRAPHY

## 2017-10-14 DIAGNOSIS — R5381 Other malaise: Secondary | ICD-10-CM | POA: Diagnosis not present

## 2017-10-14 DIAGNOSIS — R5383 Other fatigue: Secondary | ICD-10-CM | POA: Diagnosis not present

## 2017-10-14 DIAGNOSIS — R1084 Generalized abdominal pain: Secondary | ICD-10-CM | POA: Diagnosis not present

## 2017-10-14 DIAGNOSIS — K5900 Constipation, unspecified: Secondary | ICD-10-CM | POA: Diagnosis not present

## 2017-10-15 ENCOUNTER — Ambulatory Visit (INDEPENDENT_AMBULATORY_CARE_PROVIDER_SITE_OTHER): Payer: Medicare Other | Admitting: Rheumatology

## 2017-10-15 ENCOUNTER — Encounter: Payer: Self-pay | Admitting: Rheumatology

## 2017-10-15 ENCOUNTER — Other Ambulatory Visit: Payer: Self-pay | Admitting: Physician Assistant

## 2017-10-15 ENCOUNTER — Telehealth: Payer: Self-pay | Admitting: Rheumatology

## 2017-10-15 VITALS — BP 138/66 | HR 58 | Resp 14 | Ht 65.0 in | Wt 134.2 lb

## 2017-10-15 DIAGNOSIS — M25562 Pain in left knee: Secondary | ICD-10-CM

## 2017-10-15 DIAGNOSIS — Z8639 Personal history of other endocrine, nutritional and metabolic disease: Secondary | ICD-10-CM

## 2017-10-15 DIAGNOSIS — H02831 Dermatochalasis of right upper eyelid: Secondary | ICD-10-CM

## 2017-10-15 DIAGNOSIS — L280 Lichen simplex chronicus: Secondary | ICD-10-CM

## 2017-10-15 DIAGNOSIS — G894 Chronic pain syndrome: Secondary | ICD-10-CM

## 2017-10-15 DIAGNOSIS — Z8719 Personal history of other diseases of the digestive system: Secondary | ICD-10-CM

## 2017-10-15 DIAGNOSIS — G8929 Other chronic pain: Secondary | ICD-10-CM

## 2017-10-15 DIAGNOSIS — H02834 Dermatochalasis of left upper eyelid: Secondary | ICD-10-CM

## 2017-10-15 DIAGNOSIS — F17200 Nicotine dependence, unspecified, uncomplicated: Secondary | ICD-10-CM

## 2017-10-15 DIAGNOSIS — R5383 Other fatigue: Secondary | ICD-10-CM

## 2017-10-15 DIAGNOSIS — I7300 Raynaud's syndrome without gangrene: Secondary | ICD-10-CM

## 2017-10-15 DIAGNOSIS — Z8673 Personal history of transient ischemic attack (TIA), and cerebral infarction without residual deficits: Secondary | ICD-10-CM

## 2017-10-15 DIAGNOSIS — Z8619 Personal history of other infectious and parasitic diseases: Secondary | ICD-10-CM

## 2017-10-15 DIAGNOSIS — M3500 Sicca syndrome, unspecified: Secondary | ICD-10-CM

## 2017-10-15 DIAGNOSIS — Z8659 Personal history of other mental and behavioral disorders: Secondary | ICD-10-CM

## 2017-10-15 DIAGNOSIS — M25561 Pain in right knee: Secondary | ICD-10-CM | POA: Diagnosis not present

## 2017-10-15 DIAGNOSIS — Z8669 Personal history of other diseases of the nervous system and sense organs: Secondary | ICD-10-CM

## 2017-10-15 NOTE — Telephone Encounter (Signed)
Patient called stating she forgot to mention at her appointment today that she has been constipated for 5 days and is not sure what she should take.  Patient requested a return call.

## 2017-10-16 ENCOUNTER — Telehealth: Payer: Self-pay

## 2017-10-16 NOTE — Telephone Encounter (Signed)
Left message to advised patient she should contact PCP for evaluation.

## 2017-10-16 NOTE — Telephone Encounter (Signed)
Left pt vm msg regarding to clarify request for Neurology referral. Direct call back info provided.

## 2017-10-16 NOTE — Telephone Encounter (Signed)
Patient needs to tell us the reason for referral before an order can be placed. Insurance may also require a visit here first. Haven't seen her since 04/2017

## 2017-10-16 NOTE — Telephone Encounter (Signed)
Pt called - she had an appt w/Rheumatogist. As per pt, the doctor recommended her to complete a Neurology referral. Pls advise, thanks.

## 2017-10-17 DIAGNOSIS — R5383 Other fatigue: Secondary | ICD-10-CM | POA: Diagnosis not present

## 2017-10-17 DIAGNOSIS — M25561 Pain in right knee: Secondary | ICD-10-CM | POA: Diagnosis not present

## 2017-10-17 DIAGNOSIS — G894 Chronic pain syndrome: Secondary | ICD-10-CM | POA: Diagnosis not present

## 2017-10-17 DIAGNOSIS — M3500 Sicca syndrome, unspecified: Secondary | ICD-10-CM | POA: Diagnosis not present

## 2017-10-17 DIAGNOSIS — M25562 Pain in left knee: Secondary | ICD-10-CM | POA: Diagnosis not present

## 2017-10-17 DIAGNOSIS — Z8673 Personal history of transient ischemic attack (TIA), and cerebral infarction without residual deficits: Secondary | ICD-10-CM | POA: Diagnosis not present

## 2017-10-17 NOTE — Telephone Encounter (Addendum)
Contacted pt - she does not know why the Rheumatologist referred her for Neurology referral. Pt gave me the contact office number for [facility] Neurologist for an update at [phone]. The office was closed. Left a detailed vm msg for office to return call regarding referral request.

## 2017-10-21 ENCOUNTER — Telehealth: Payer: Self-pay

## 2017-10-21 DIAGNOSIS — G629 Polyneuropathy, unspecified: Secondary | ICD-10-CM

## 2017-10-21 NOTE — Telephone Encounter (Signed)
Contacted Armed forces technical officer at [phone]. As per Latoya Cummings, pt has not been seen in the office yet. No records from Rheumatologist as to why referral is being requested. Tried getting clarification from pt. She thinks referral is for neuropathy. She wants to re-establish with a Neurologist. Pls send referral to [facility] Neurology. Thanks.

## 2017-10-22 NOTE — Telephone Encounter (Signed)
Referral sent 

## 2017-10-23 ENCOUNTER — Encounter: Payer: Self-pay | Admitting: Osteopathic Medicine

## 2017-10-23 ENCOUNTER — Ambulatory Visit (INDEPENDENT_AMBULATORY_CARE_PROVIDER_SITE_OTHER): Payer: Medicare Other | Admitting: Osteopathic Medicine

## 2017-10-23 VITALS — BP 126/77 | HR 65 | Temp 98.7°F | Wt 132.2 lb

## 2017-10-23 DIAGNOSIS — M899 Disorder of bone, unspecified: Secondary | ICD-10-CM | POA: Diagnosis not present

## 2017-10-23 DIAGNOSIS — Z0000 Encounter for general adult medical examination without abnormal findings: Secondary | ICD-10-CM | POA: Diagnosis not present

## 2017-10-23 DIAGNOSIS — L989 Disorder of the skin and subcutaneous tissue, unspecified: Secondary | ICD-10-CM | POA: Diagnosis not present

## 2017-10-23 DIAGNOSIS — Z0100 Encounter for examination of eyes and vision without abnormal findings: Secondary | ICD-10-CM

## 2017-10-23 MED ORDER — ZOSTER VAC RECOMB ADJUVANTED 50 MCG/0.5ML IM SUSR: 0.5000 mL | Freq: Once | INTRAMUSCULAR | 1 refills | Status: AC

## 2017-10-23 NOTE — Progress Notes (Signed)
HPI: Latoya Cummings is a 72 y.o. female who  has a past medical history of Back pain, Cataract, Depression, Depression, GERD (gastroesophageal reflux disease), Hepatitis C (12/07/2016), Hyperlipidemia, Radicular pain, and Sjogren's disease (HCC).  she presents to [facility] today, 10/23/17,  for chief complaint of: Annual Check Up    Patient here for annual physical / wellness exam.  See preventive care reviewed as below.    Additional concerns today include:  Requests referral for dermatology and ophthalmology    Past medical, surgical, social and family history reviewed:  Patient Active Problem List   Diagnosis Date Noted  . 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 Dermatochalasis of both upper eyelids 06/29/2015  . 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 Sjogren's disease (HCC) 11/11/2014  . Panic disorder 02/08/2013  . Chronic pain syndrome 11/09/2011  . Neuropathic pain of lower extremity 11/09/2011  . Back pain with radiation 07/05/2011  . Lichenification and lichen simplex chronicus 12/27/2010  . 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 Neuropathy 12/27/2010  . History of hysterectomy with oophorectomy 11/27/2010  . Pain in joints 11/27/2010  . Personality disorder (HCC) 10/22/2010  . Tobacco use disorder 10/22/2010  . Esophageal reflux 06/09/2009  . Major depressive disorder, single episode 12/13/2008  . Transient cerebral ischemia 11/18/2008    Past Surgical History:  Procedure Laterality Date  . ABDOMINAL HYSTERECTOMY      Social History   Tobacco Use  . Smoking status: Former Smoker    Types: Cigarettes    Last attempt to quit: 1999    Years since quitting: 20.8  . Smokeless tobacco: Never Used  Substance Use Topics  . Alcohol use: No    Family History  Problem Relation Age of Onset  . Cancer Mother   . Asthma Brother   . Sickle cell anemia Brother   . Cancer Brother   . Lupus Brother       Current medication list and allergy/intolerance information reviewed:    Current Outpatient Medications  Medication Sig Dispense Refill  . B Complex Vitamins (B COMPLEX PO) Take by mouth daily.    . Cyanocobalamin (VITAMIN B-12 PO) Take by mouth daily.    . fluticasone (FLONASE) 50 MCG/ACT nasal spray USE 2 SPRAYS BY NASAL ROUTE DAILY. 48 g 3  . gabapentin (NEURONTIN) 800 MG tablet TAKE 1 TABLET IN THE MORNING AND TAKE 2 TABLETS AT BEDTIME 270 tablet 0  . GINKGO BILOBA PO Take by mouth.    Marland Kitchen ibuprofen (ADVIL,MOTRIN) 800 MG tablet Take 1 tablet (800 mg total) by mouth every 8 (eight) hours as needed. 30 tablet 1  . Omega-3 Fatty Acids (FISH OIL PO) Take by mouth as needed.     . pantoprazole (PROTONIX) 40 MG tablet Take 40 mg by mouth daily.     Marland Kitchen pyridOXINE (VITAMIN B-6) 100 MG tablet Take by mouth daily.    Marland Kitchen Zoster Vaccine Adjuvanted [facility]) injection Inject 0.5 mLs into the muscle once for 1 dose. Repeat 2-6 mos 0.5 mL 1   No current facility-administered medications for this visit.     Allergies  Allergen Reactions  . Aspirin Other (See Comments)    Severe pain Gastritis "ASA has to be coated"  . Moxifloxacin Other (See Comments)    Turned eye black, jaw pain, rash  . Tape Rash  . Tuberculin Ppd Rash      Review of Systems:  Constitutional:  No  fever, no chills,  No recent illness, No unintentional weight changes. No significant fatigue.   HEENT: No  headache, no vision change, no hearing change, No sore throat, No  sinus pressure  Cardiac: No  chest pain, No  pressure, No palpitations, No  Orthopnea  Respiratory:  No  shortness of breath. No  Cough  Gastrointestinal: No  abdominal pain, No  nausea, No  vomiting,  No  blood in stool, No  diarrhea, No  constipation   Musculoskeletal: No new myalgia/arthralgia  Skin: No  Rash, No other wounds/concerning lesions  Genitourinary: No  incontinence, No  abnormal genital bleeding, No abnormal genital  discharge  Hem/Onc: No  easy bruising/bleeding, No  abnormal lymph node  Endocrine: No cold intolerance,  No heat intolerance. No polyuria/polydipsia/polyphagia   Neurologic: No  weakness, No  dizziness, No  slurred speech/focal weakness/facial droop  Psychiatric: No  concerns with depression, No  concerns with anxiety, No sleep problems, No mood problems  Exam:  BP 126/77 (BP Location: Left Arm, Patient Position: Sitting, Cuff Size: Normal)   Pulse 65   Temp 98.7 F (37.1 C) (Oral)   Wt 132 lb 3.2 oz (60 kg)   BMI 22.00 kg/m   Constitutional: VS see above. General Appearance: alert, well-developed, well-nourished, NAD  Eyes: Normal lids and conjunctive, non-icteric sclera  Ears, Nose, Mouth, Throat: MMM, Normal external inspection ears/nares/mouth/lips/gums. TM normal bilaterally. Pharynx/tonsils no erythema, no exudate. Nasal mucosa normal.   Neck: No masses, trachea midline. No thyroid enlargement. No tenderness/mass appreciated. No lymphadenopathy  Respiratory: Normal respiratory effort. no wheeze, no rhonchi, no rales  Cardiovascular: S1/S2 normal, no murmur, no rub/gallop auscultated. RRR. No lower extremity edema. Pedal pulse II/IV bilaterally DP and PT. No carotid bruit or JVD. No abdominal aortic bruit.  Gastrointestinal: Nontender, no masses. No hepatomegaly, no splenomegaly. No hernia appreciated. Bowel sounds normal. Rectal exam deferred.   Musculoskeletal: Gait normal. No clubbing/cyanosis of digits.   Neurological: Normal balance/coordination. No tremor. No cranial nerve deficit on limited exam. Motor and sensation intact and symmetric. Cerebellar reflexes intact.   Skin: warm, dry, intact. No rash/ulcer. No concerning nevi or subq nodules on limited exam.    Psychiatric: Normal judgment/insight. Normal mood and affect. Oriented x3.       ASSESSMENT/PLAN: The primary encounter diagnosis was Annual physical exam. A diagnosis of Bone disorder was also  pertinent to this visit.  Orders Placed This Encounter  Procedures  . DG Bone Density     Patient Instructions   General Preventive Care  Most recent routine screening lipids/other labs: will update cholesterol labs at your convenience. Cholesterol and Diabetes screening usually recommended annually.   Everyone should have blood pressure checked once per year.   Tobacco: don't! Alcohol: responsible moderation is ok for most adults - if you have concerns about your alcohol intake, please talk to me! Recreational/Illicit Drugs: don't!  Exercise: as tolerated to reduce risk of cardiovascular disease and diabetes. Strength training will also prevent osteoporosis.   Mental health: if need for mental health care (medicines, counseling, other), or concerns about moods, please let me know!   Sexual health: if need for STD testing, or if concerns with libido/pain problems, please let me know!  Vaccines  Flu vaccine: recommended for almost everyone, every fall (by Halloween! Flu is scary!)  Shingles vaccine: Shingrix recommended - new Shingles vaccine. Medicare will pay for this through your pharmacy but not here - see printed Rx  Pneumonia vaccines: already done  Tetanus booster: Tdap recommended every 10  years - done 2012 Cancer screenings   Colon cancer screening: No colonoscopy or other testing on file except old test from 2007, will request records from [facility]   Breast cancer screening: mammogram recommended at age 56 every other year at least, and annually after age 54.   Cervical cancer screening: Can stop Pap at age 74 or w/ hysterectomy as long as previous testing was normal.  Infection screenings . Hepatitis C: recommended once for anyone born 25-1963 - already done Other . Bone Density Test: recommended for women at age 72, men at age 28, sooner depending on risk factors . Advanced Directive: Living Will and/or Healthcare Power of Attorney recommended for all  adults, regardless of age or health!            Immunization History  Administered Date(s) Administered  . Hepatitis B, adult 03/04/2017, 04/10/2017, 09/10/2017  . Influenza, High Dose Seasonal PF 09/27/2014  . Influenza-Unspecified 10/02/2007, 11/10/2013  . Pneumococcal Conjugate-13 09/27/2014, 02/01/2016  . Pneumococcal Polysaccharide-23 09/24/2012  . Pneumococcal-Unspecified 09/24/2012  . Tdap 01/30/2010  . Zoster 02/09/2010             Visit summary with medication list and pertinent instructions was printed for patient to review. All questions at time of visit were answered - patient instructed to contact office with any additional concerns. ER/RTC precautions were reviewed with the patient.   Follow-up plan: Return in about 1 year (around 10/24/2018) for annual check up .   Please note: voice recognition software was used to produce this document, and typos may escape review. Please contact Dr. Lyn Hollingshead for any needed clarifications.

## 2017-10-23 NOTE — Patient Instructions (Signed)
General Preventive Care  Most recent routine screening lipids/other labs: will update cholesterol labs at your convenience. Cholesterol and Diabetes screening usually recommended annually.   Everyone should have blood pressure checked once per year.   Tobacco: don't! Alcohol: responsible moderation is ok for most adults - if you have concerns about your alcohol intake, please talk to me! Recreational/Illicit Drugs: don't!  Exercise: as tolerated to reduce risk of cardiovascular disease and diabetes. Strength training will also prevent osteoporosis.   Mental health: if need for mental health care (medicines, counseling, other), or concerns about moods, please let me know!   Sexual health: if need for STD testing, or if concerns with libido/pain problems, please let me know!  Vaccines  Flu vaccine: recommended for almost everyone, every fall (by Halloween! Flu is scary!)  Shingles vaccine: Shingrix recommended - new Shingles vaccine. Medicare will pay for this through your pharmacy but not here - see printed Rx  Pneumonia vaccines: already done  Tetanus booster: Tdap recommended every 10 years - done 2012 Cancer screenings   Colon cancer screening: No colonoscopy or other testing on file except old test from 2007, will request records from [facility]   Breast cancer screening: mammogram recommended at age 17 every other year at least, and annually after age 44.   Cervical cancer screening: Can stop Pap at age 62 or w/ hysterectomy as long as previous testing was normal.  Infection screenings . Hepatitis C: recommended once for anyone born 46-1963 - already done Other . Bone Density Test: recommended for women at age 14, men at age 37, sooner depending on risk factors . Advanced Directive: Living Will and/or Healthcare Power of Attorney recommended for all adults, regardless of age or health!            Immunization History  Administered Date(s) Administered  .  Hepatitis B, adult 03/04/2017, 04/10/2017, 09/10/2017  . Influenza, High Dose Seasonal PF 09/27/2014  . Influenza-Unspecified 10/02/2007, 11/10/2013  . Pneumococcal Conjugate-13 09/27/2014, 02/01/2016  . Pneumococcal Polysaccharide-23 09/24/2012  . Pneumococcal-Unspecified 09/24/2012  . Tdap 01/30/2010  . Zoster 02/09/2010

## 2017-10-27 LAB — CBC WITH DIFFERENTIAL/PLATELET
Basophils Absolute: 20 cells/uL (ref 0–200)
Basophils Relative: 0.3 %
Eosinophils Absolute: 40 cells/uL (ref 15–500)
Eosinophils Relative: 0.6 %
HCT: 39.9 % (ref 35.0–45.0)
Hemoglobin: 13.5 g/dL (ref 11.7–15.5)
LYMPHS ABS: 1373 {cells}/uL (ref 850–3900)
MCH: 29.5 pg (ref 27.0–33.0)
MCHC: 33.8 g/dL (ref 32.0–36.0)
MCV: 87.3 fL (ref 80.0–100.0)
MPV: 11.5 fL (ref 7.5–12.5)
Monocytes Relative: 6.7 %
Neutro Abs: 4726 cells/uL (ref 1500–7800)
Neutrophils Relative %: 71.6 %
PLATELETS: 184 10*3/uL (ref 140–400)
RBC: 4.57 10*6/uL (ref 3.80–5.10)
RDW: 12.3 % (ref 11.0–15.0)
TOTAL LYMPHOCYTE: 20.8 %
WBC: 6.6 10*3/uL (ref 3.8–10.8)
WBCMIX: 442 {cells}/uL (ref 200–950)

## 2017-10-27 LAB — LUPUS ANTICOAGULANT EVAL W/ REFLEX
PTT-LA Screen: 36 s (ref <=40)
dRVVT: 29 s (ref <=45)

## 2017-10-27 LAB — BETA-2 GLYCOPROTEIN ANTIBODIES
Beta-2 Glyco 1 IgA: <9 SAU (ref <=20)
Beta-2 Glyco 1 IgM: <9 SMU (ref <=20)
Beta-2 Glyco I IgG: <9 SGU (ref <=20)

## 2017-10-27 LAB — SEDIMENTATION RATE: SED RATE: 11 mm/h (ref 0–30)

## 2017-10-27 LAB — COMPLETE METABOLIC PANEL WITH GFR
AG Ratio: 1.4 (calc) (ref 1.0–2.5)
ALT: 9 U/L (ref 6–29)
AST: 23 U/L (ref 10–35)
Albumin: 4.3 g/dL (ref 3.6–5.1)
Alkaline phosphatase (APISO): 70 U/L (ref 33–130)
BUN: 10 mg/dL (ref 7–25)
CO2: 28 mmol/L (ref 20–32)
CREATININE: 0.69 mg/dL (ref 0.60–0.93)
Calcium: 9.7 mg/dL (ref 8.6–10.4)
Chloride: 105 mmol/L (ref 98–110)
GFR, Est African American: 102 mL/min/{1.73_m2} (ref >=60)
GFR, Est Non African American: 88 mL/min/{1.73_m2} (ref >=60)
GLUCOSE: 89 mg/dL (ref 65–99)
Globulin: 3 g/dL (calc) (ref 1.9–3.7)
Potassium: 4.1 mmol/L (ref 3.5–5.3)
Sodium: 142 mmol/L (ref 135–146)
Total Bilirubin: 0.4 mg/dL (ref 0.2–1.2)
Total Protein: 7.3 g/dL (ref 6.1–8.1)

## 2017-10-27 LAB — PROTEIN ELECTROPHORESIS, SERUM, WITH REFLEX
ALBUMIN ELP: 4.4 g/dL (ref 3.8–4.8)
ALPHA 1: 0.3 g/dL (ref 0.2–0.3)
ALPHA 2: 0.7 g/dL (ref 0.5–0.9)
BETA 2: 0.5 g/dL (ref 0.2–0.5)
Beta Globulin: 0.4 g/dL (ref 0.4–0.6)
Gamma Globulin: 1.3 g/dL (ref 0.8–1.7)
TOTAL PROTEIN: 7.6 g/dL (ref 6.1–8.1)

## 2017-10-27 LAB — IFE INTERPRETATION

## 2017-10-27 LAB — URINALYSIS, ROUTINE W REFLEX MICROSCOPIC
Bilirubin Urine: NEGATIVE
Glucose, UA: NEGATIVE
Hgb urine dipstick: NEGATIVE
Ketones, ur: NEGATIVE
Leukocytes, UA: NEGATIVE
NITRITE: NEGATIVE
Protein, ur: NEGATIVE
SPECIFIC GRAVITY, URINE: 1.009 (ref 1.001–1.03)
pH: 7 (ref 5.0–8.0)

## 2017-10-27 LAB — ANGIOTENSIN CONVERTING ENZYME: ANGIOTENSIN-CONVERTING ENZYME: 26 U/L (ref 9–67)

## 2017-10-27 LAB — C3 AND C4
C3 COMPLEMENT: 116 mg/dL (ref 83–193)
C4 Complement: 24 mg/dL (ref 15–57)

## 2017-10-27 LAB — PAN-ANCA: ANCA SCREEN: NEGATIVE

## 2017-10-27 LAB — HIV ANTIBODY (ROUTINE TESTING W REFLEX): HIV 1&2 Ab, 4th Generation: NONREACTIVE

## 2017-10-27 LAB — RHEUMATOID FACTOR

## 2017-10-27 LAB — QUANTIFERON-TB GOLD PLUS
NIL: 0.02 IU/mL
QuantiFERON-TB Gold Plus: NEGATIVE
TB1-NIL: 0 [IU]/mL
TB2-NIL: 0.01 IU/mL

## 2017-10-27 LAB — RNP ANTIBODY: Ribonucleic Protein(ENA) Antibody, IgG: <1 AI

## 2017-10-27 LAB — SJOGRENS SYNDROME-A EXTRACTABLE NUCLEAR ANTIBODY: SSA (RO) (ENA) ANTIBODY, IGG: NEGATIVE AI

## 2017-10-27 LAB — ANTI-SCLERODERMA ANTIBODY: SCLERODERMA (SCL-70) (ENA) ANTIBODY, IGG: NEGATIVE AI

## 2017-10-27 LAB — SJOGRENS SYNDROME-B EXTRACTABLE NUCLEAR ANTIBODY: SSB (La) (ENA) Antibody, IgG: <1 AI

## 2017-10-27 LAB — CRYOGLOBULIN: Cryoglobulin, Qualitative Analysis: NOT DETECTED

## 2017-10-27 LAB — THIOPURINE METHYLTRANSFERASE (TPMT), RBC: THIOPURINE METHYLTRANSFERASE, RBC: 15 nmol/h/mL

## 2017-10-27 LAB — ANTI-SMITH ANTIBODY: ENA SM Ab Ser-aCnc: <1 AI

## 2017-10-27 LAB — CARDIOLIPIN ANTIBODIES, IGG, IGM, IGA

## 2017-10-27 LAB — ANTI-DNA ANTIBODY, DOUBLE-STRANDED: ds DNA Ab: <1 IU/mL

## 2017-10-27 LAB — TSH: TSH: 1.2 m[IU]/L (ref 0.40–4.50)

## 2017-10-27 LAB — ANA: ANA: NEGATIVE

## 2017-10-27 LAB — CK: CK TOTAL: 77 U/L (ref 29–143)

## 2017-10-27 LAB — GLUCOSE 6 PHOSPHATE DEHYDROGENASE: G-6PDH: 18.6 U/g{Hb} (ref 7.0–20.5)

## 2017-10-27 NOTE — Progress Notes (Signed)
Abnormal IFE noted.  Please refer patient to hematology.

## 2017-10-28 ENCOUNTER — Telehealth: Payer: Self-pay | Admitting: Rheumatology

## 2017-10-28 DIAGNOSIS — R778 Other specified abnormalities of plasma proteins: Secondary | ICD-10-CM

## 2017-10-28 NOTE — Telephone Encounter (Signed)
Patient left a voicemail stating "From now on just refer to me in writing or send me a letter.  The wait time for in-patient is long and I don't have time to wait 20-30 minutes.  You have my address   I won't be coming back over there because it is too messy for me and I don't have time to deal with that kind of disorganization.   My time is valuable and I am dissatisfied and never got referral for a doctor to see and then left on hold and my time is valuable.  I appreciate it and don't call me just send me a letter."

## 2017-10-28 NOTE — Telephone Encounter (Signed)
Please make a referral to neurologist per patient's request during the last visit.  Make a referral to hematology for abnormal IFE.  Please send a note to patient in writing per her request. Please, mail copy of her lab results to her PCP.  All autoimmune work-up is negative.  You may cancel her follow-up visit.

## 2017-10-29 ENCOUNTER — Encounter: Payer: Self-pay | Admitting: *Deleted

## 2017-10-29 NOTE — Telephone Encounter (Signed)
Patient has a referral to [facility] already placed in epic by Sunnie Nielsen, DO. Hematology referral placed for patient. Copy of labs sent to PCP. Letter mailed to patient along with copy of lab results.

## 2017-10-30 ENCOUNTER — Encounter: Payer: Self-pay | Admitting: Osteopathic Medicine

## 2017-11-13 ENCOUNTER — Ambulatory Visit: Payer: 59 | Admitting: Rheumatology

## 2017-11-14 ENCOUNTER — Telehealth: Payer: Self-pay

## 2017-11-14 ENCOUNTER — Ambulatory Visit (INDEPENDENT_AMBULATORY_CARE_PROVIDER_SITE_OTHER): Payer: Medicare Other

## 2017-11-14 ENCOUNTER — Telehealth: Payer: Self-pay | Admitting: Hematology

## 2017-11-14 ENCOUNTER — Encounter: Payer: Self-pay | Admitting: Physician Assistant

## 2017-11-14 ENCOUNTER — Ambulatory Visit (INDEPENDENT_AMBULATORY_CARE_PROVIDER_SITE_OTHER): Payer: Medicare Other | Admitting: Physician Assistant

## 2017-11-14 VITALS — BP 148/73 | HR 77 | Temp 98.6°F | Wt 133.0 lb

## 2017-11-14 DIAGNOSIS — R51 Headache: Secondary | ICD-10-CM

## 2017-11-14 DIAGNOSIS — R519 Headache, unspecified: Secondary | ICD-10-CM

## 2017-11-14 DIAGNOSIS — I878 Other specified disorders of veins: Secondary | ICD-10-CM | POA: Insufficient documentation

## 2017-11-14 MED ORDER — PREDNISONE 50 MG PO TABS: 50.0000 mg | ORAL_TABLET | Freq: Every day | ORAL | 0 refills | Status: AC

## 2017-11-14 NOTE — Progress Notes (Signed)
CT Head negative. Called patient at 4:55pm. No voicemail set-up

## 2017-11-14 NOTE — Telephone Encounter (Signed)
RN Revonda Standard from [facility] called - pt called Latoya Cummings nurse triage line with c/o pain & pressure in temple area. Pt was triaged & it was recommended by triage nurse for pt to be seen within 8 hrs for possible temporal arthritis. Pls advise, thanks.

## 2017-11-14 NOTE — Telephone Encounter (Signed)
Would agree, patient needs appointment or ER visit

## 2017-11-14 NOTE — Patient Instructions (Addendum)
1. Go downstairs to the imaging department for your Head scan 2. Go to the lab for your blood work 3. Go to the pharmacy for your prednisone. Start this today and continue for a total of 5 days   General Headache Without Cause A headache is pain or discomfort felt around the head or neck area. There are many causes and types of headaches. In some cases, the cause may not be found. Follow these instructions at home: Managing pain  Take over-the-counter and prescription medicines only as told by your doctor.  Lie down in a dark, quiet room when you have a headache.  If directed, apply ice to the head and neck area: ? Put ice in a plastic bag. ? Place a towel between your skin and the bag. ? Leave the ice on for 20 minutes, 2-3 times per day.  Use a heating pad or hot shower to apply heat to the head and neck area as told by your doctor.  Keep lights dim if bright lights bother you or make your headaches worse. Eating and drinking  Eat meals on a regular schedule.  Lessen how much alcohol you drink.  Lessen how much caffeine you drink, or stop drinking caffeine. General instructions  Keep all follow-up visits as told by your doctor. This is important.  Keep a journal to find out if certain things bring on headaches. For example, write down: ? What you eat and drink. ? How much sleep you get. ? Any change to your diet or medicines.  Relax by getting a massage or doing other relaxing activities.  Lessen stress.  Sit up straight. Do not tighten (tense) your muscles.  Do not use tobacco products. This includes cigarettes, chewing tobacco, or e-cigarettes. If you need help quitting, ask your doctor.  Exercise regularly as told by your doctor.  Get enough sleep. This often means 7-9 hours of sleep. Contact a doctor if:  Your symptoms are not helped by medicine.  You have a headache that feels different than the other headaches.  You feel sick to your stomach (nauseous)  or you throw up (vomit).  You have a fever. Get help right away if:  Your headache becomes really bad.  You keep throwing up.  You have a stiff neck.  You have trouble seeing.  You have trouble speaking.  You have pain in the eye or ear.  Your muscles are weak or you lose muscle control.  You lose your balance or have trouble walking.  You feel like you will pass out (faint) or you pass out.  You have confusion. This information is not intended to replace advice given to you by your health care provider. Make sure you discuss any questions you have with your health care provider. Document Released: 10/03/2007 Document Revised: 06/01/2015 Document Reviewed: 04/18/2014 Elsevier Interactive Patient Education  [employer].
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
## 2017-11-14 NOTE — Progress Notes (Signed)
   Procedure: Real-time Ultrasound Guided needle placement for venipuncture Device: GE Logiq E  Verbal informed consent obtained.  Time-out conducted.  Noted no overlying erythema, induration, or other signs of local infection.  Skin prepped in a sterile fashion.  With sterile technique and under real time ultrasound guidance: Butterfly needle advanced into the venae comitans of the brachial artery at the elbow taking care to avoid arterial puncture, excellent venous blood was obtained for lab testing. Completed without difficulty  Advised to call if fevers/chills, erythema, induration, drainage, or persistent bleeding.  Images permanently stored and available for review in the ultrasound unit.  Impression: Technically successful ultrasound guided injection.  Poor venous access Unsuccessful venipuncture by the lab, successful ultrasound-guided venipuncture of the left brachial artery venae comitans with no evidence of arterial puncture.

## 2017-11-14 NOTE — Telephone Encounter (Signed)
lmom for pt to return call to office re new patient referral. Appt letter mailed 11/8 for appt on 12/3 at 12 pm

## 2017-11-14 NOTE — Assessment & Plan Note (Signed)
Unsuccessful venipuncture by the lab, successful ultrasound-guided venipuncture of the left brachial artery venae comitans with no evidence of arterial puncture.

## 2017-11-14 NOTE — Telephone Encounter (Signed)
As per pt, has a 130 appt w/Charley today. Pt confirmed to keep appt.

## 2017-11-15 LAB — CBC WITH DIFFERENTIAL/PLATELET
BASOS ABS: 28 {cells}/uL (ref 0–200)
Basophils Relative: 0.4 %
EOS ABS: 57 {cells}/uL (ref 15–500)
EOS PCT: 0.8 %
HEMATOCRIT: 38 % (ref 35.0–45.0)
Hemoglobin: 12.9 g/dL (ref 11.7–15.5)
Lymphs Abs: 2116 cells/uL (ref 850–3900)
MCH: 30.2 pg (ref 27.0–33.0)
MCHC: 33.9 g/dL (ref 32.0–36.0)
MCV: 89 fL (ref 80.0–100.0)
MPV: 10.9 fL (ref 7.5–12.5)
Monocytes Relative: 7 %
NEUTROS ABS: 4402 {cells}/uL (ref 1500–7800)
NEUTROS PCT: 62 %
Platelets: 213 10*3/uL (ref 140–400)
RBC: 4.27 10*6/uL (ref 3.80–5.10)
RDW: 12.3 % (ref 11.0–15.0)
Total Lymphocyte: 29.8 %
WBC: 7.1 10*3/uL (ref 3.8–10.8)
WBCMIX: 497 {cells}/uL (ref 200–950)

## 2017-11-15 LAB — BASIC METABOLIC PANEL
BUN: 12 mg/dL (ref 7–25)
CALCIUM: 9.9 mg/dL (ref 8.6–10.4)
CO2: 32 mmol/L (ref 20–32)
Chloride: 102 mmol/L (ref 98–110)
Creat: 0.74 mg/dL (ref 0.60–0.93)
Glucose, Bld: 84 mg/dL (ref 65–99)
POTASSIUM: 4.3 mmol/L (ref 3.5–5.3)
SODIUM: 139 mmol/L (ref 135–146)

## 2017-11-15 LAB — C-REACTIVE PROTEIN: CRP: 0.6 mg/L (ref <8.0)

## 2017-11-15 LAB — SEDIMENTATION RATE: Sed Rate: 19 mm/h (ref 0–30)

## 2017-11-16 ENCOUNTER — Encounter: Payer: Self-pay | Admitting: Physician Assistant

## 2017-11-16 DIAGNOSIS — R519 Headache, unspecified: Secondary | ICD-10-CM | POA: Insufficient documentation

## 2017-11-16 DIAGNOSIS — R51 Headache: Principal | ICD-10-CM

## 2017-11-18 ENCOUNTER — Ambulatory Visit: Payer: Self-pay | Admitting: Osteopathic Medicine

## 2017-11-19 ENCOUNTER — Telehealth: Payer: Self-pay | Admitting: Rheumatology

## 2017-11-19 NOTE — Telephone Encounter (Signed)
Patient left a voicemail stating she received a letter from Dr. Corliss Skainseveshwar about a referral to the cancer [facility] and is requesting a return call "to let her know what the appointment is about and to get more information."

## 2017-11-19 NOTE — Telephone Encounter (Signed)
Attempted to contact the patient and left message for patient to call the office.  

## 2017-11-20 ENCOUNTER — Telehealth: Payer: Self-pay | Admitting: Hematology

## 2017-11-20 ENCOUNTER — Telehealth: Payer: Self-pay | Admitting: Rheumatology

## 2017-11-20 NOTE — Telephone Encounter (Signed)
pt called office to cxl new patient appt on 12/3.Latoya Cummings. Pt will call back to sch at a later date

## 2017-11-20 NOTE — Telephone Encounter (Signed)
Patient left a voicemail stating she was returning your call.   

## 2017-11-21 NOTE — Telephone Encounter (Signed)
Returned patient's call and patient was questioning the hematology referral. I explained to patient the referral was placed for abnormal IFE and patient verbalized understanding.

## 2017-12-03 ENCOUNTER — Other Ambulatory Visit: Payer: Medicare Other

## 2017-12-09 ENCOUNTER — Other Ambulatory Visit: Payer: Self-pay

## 2017-12-09 ENCOUNTER — Ambulatory Visit: Payer: Self-pay | Admitting: Hematology

## 2017-12-09 DIAGNOSIS — M9903 Segmental and somatic dysfunction of lumbar region: Secondary | ICD-10-CM | POA: Diagnosis not present

## 2017-12-09 DIAGNOSIS — M6240 Contracture of muscle, unspecified site: Secondary | ICD-10-CM | POA: Diagnosis not present

## 2017-12-10 ENCOUNTER — Encounter: Payer: Self-pay | Admitting: Osteopathic Medicine

## 2017-12-10 ENCOUNTER — Ambulatory Visit (INDEPENDENT_AMBULATORY_CARE_PROVIDER_SITE_OTHER): Payer: Medicare Other | Admitting: Osteopathic Medicine

## 2017-12-10 ENCOUNTER — Ambulatory Visit (INDEPENDENT_AMBULATORY_CARE_PROVIDER_SITE_OTHER): Payer: Medicare Other

## 2017-12-10 VITALS — BP 129/44 | HR 66 | Temp 98.5°F | Wt 132.3 lb

## 2017-12-10 DIAGNOSIS — M85832 Other specified disorders of bone density and structure, left forearm: Secondary | ICD-10-CM | POA: Diagnosis not present

## 2017-12-10 DIAGNOSIS — R3989 Other symptoms and signs involving the genitourinary system: Secondary | ICD-10-CM

## 2017-12-10 DIAGNOSIS — R51 Headache: Secondary | ICD-10-CM

## 2017-12-10 DIAGNOSIS — R519 Headache, unspecified: Secondary | ICD-10-CM

## 2017-12-10 DIAGNOSIS — M810 Age-related osteoporosis without current pathological fracture: Secondary | ICD-10-CM

## 2017-12-10 DIAGNOSIS — R899 Unspecified abnormal finding in specimens from other organs, systems and tissues: Secondary | ICD-10-CM | POA: Diagnosis not present

## 2017-12-10 LAB — POCT URINALYSIS DIPSTICK
BILIRUBIN UA: NEGATIVE
GLUCOSE UA: NEGATIVE
Ketones, UA: NEGATIVE
LEUKOCYTES UA: NEGATIVE
Nitrite, UA: NEGATIVE
Protein, UA: NEGATIVE
RBC UA: NEGATIVE
SPEC GRAV UA: 1.02 (ref 1.010–1.025)
Urobilinogen, UA: 0.2 E.U./dL
pH, UA: 5 (ref 5.0–8.0)

## 2017-12-10 NOTE — Progress Notes (Signed)
HPI: Latoya Cummings is a 72 y.o. female who  has a past medical history of Back pain, Cataract, Depression, Depression, GERD (gastroesophageal reflux disease), Hepatitis C (12/07/2016), Hyperlipidemia, Radicular pain, and Sjogren's disease (HCC).  she presents to [facility] today, 12/10/17,  for chief complaint of:  "I was told I needed to follow up." Concern about labs   Pt seen about a month ago by one of my colleagues for temporal headache, this has since resolved. She was instructed to follow up with me 2-3 days from that visit and she rescheduled for today... She feels fine now, no headache.   Recently seen Dr Corliss Skains, there was some concern for abnormal SPEP for which she was referred to oncology, and pt is a bit confused about this.   Pt reports some cramping in lower abdomen/bladder on occasion, hasn't bothered here in a few days, feels like period cramping, s/p hysterectomy.       At today's visit... Past medical history, surgical history, and family history reviewed and updated as needed.  Current medication list and allergy/intolerance information reviewed and updated as needed. (See remainder of HPI, ROS, Phys Exam below)     Results for orders placed or performed in visit on 12/10/17 (from the past 72 hour(s))  POCT Urinalysis Dipstick     Status: None   Collection Time: 12/10/17  2:19 PM  Result Value Ref Range   Color, UA yellow    Clarity, UA clear    Glucose, UA Negative Negative   Bilirubin, UA Negative    Ketones, UA Negative    Spec Grav, UA 1.020 1.010 - 1.025   Blood, UA Negative    pH, UA 5.0 5.0 - 8.0   Protein, UA Negative Negative   Urobilinogen, UA 0.2 0.2 or 1.0 E.U./dL   Nitrite, UA Negative    Leukocytes, UA Negative Negative   Appearance Clear    Odor            ASSESSMENT/PLAN: The primary encounter diagnosis was Sensation of pressure in bladder area. Diagnoses of Temporal pain and Abnormal  laboratory test were also pertinent to this visit.  Sensation of pressure in bladder area - normal exam, no urinary frequency, normal UA. F/u as needed, likely crmaping pain occasional d/t scar tissue from hysterectomy, has declined colon CA screen - Plan: POCT Urinalysis Dipstick  Temporal pain - resolved, f/u prn  Abnormal laboratory test - I don't see concerning AG ratio, protein level, SPEP abnormality of any great concern though there was some mild abn band on SPEP. Advised f/u Dr Corliss Skains   Orders Placed This Encounter  Procedures  . POCT Urinalysis Dipstick          Follow-up plan: Return if symptoms worsen or fail to improve. Otherwise 10/2018 for annual check-up.                               ############################################ ############################################ ############################################ ############################################    Current Meds  Medication Sig  . B Complex Vitamins (B COMPLEX PO) Take by mouth daily.  . Cyanocobalamin (VITAMIN B-12 PO) Take by mouth daily.  . fluticasone (FLONASE) 50 MCG/ACT nasal spray USE 2 SPRAYS BY NASAL ROUTE DAILY.  Marland Kitchen gabapentin (NEURONTIN) 800 MG tablet TAKE 1 TABLET IN THE MORNING AND TAKE 2 TABLETS AT BEDTIME  . GINKGO BILOBA PO Take by mouth.  Marland Kitchen ibuprofen (ADVIL,MOTRIN) 800 MG tablet Take 1 tablet (800  mg total) by mouth every 8 (eight) hours as needed.  . Omega-3 Fatty Acids (FISH OIL PO) Take by mouth as needed.   . pantoprazole (PROTONIX) 40 MG tablet Take 40 mg by mouth daily.   Marland Kitchen. pyridOXINE (VITAMIN B-6) 100 MG tablet Take by mouth daily.    Allergies  Allergen Reactions  . Aspirin Other (See Comments)    Severe pain Gastritis "ASA has to be coated"  . Moxifloxacin Other (See Comments)    Turned eye black, jaw pain, rash  . Tape Rash  . Tuberculin Ppd Rash       Review of Systems:  Constitutional: No recent illness  HEENT: No   headache, no vision change  Cardiac: No  chest pain, No  pressure, No palpitations  Respiratory:  No  shortness of breath. No  Cough  Gastrointestinal: +abdominal pain, no change on bowel habits  Musculoskeletal: No new myalgia/arthralgia  Skin: No  Rash  Hem/Onc: No  easy bruising/bleeding, No  abnormal lumps/bumps  Neurologic: No  weakness, No  Dizziness  Psychiatric: No  concerns with depression, No  concerns with anxiety  Exam:  BP (!) 129/44   Pulse 66   Temp 98.5 F (36.9 C) (Oral)   Wt 132 lb 4.8 oz (60 kg)   SpO2 100%   BMI 22.02 kg/m   Constitutional: VS see above. General Appearance: alert, well-developed, well-nourished, NAD  Eyes: Normal lids and conjunctive, non-icteric sclera  Ears, Nose, Mouth, Throat: MMM, Normal external inspection ears/nares/mouth/lips/gums.  Neck: No masses, trachea midline.   Respiratory: Normal respiratory effort. no wheeze, no rhonchi, no rales  Cardiovascular: S1/S2 normal, no murmur, no rub/gallop auscultated. RRR.   Musculoskeletal: Gait normal. Symmetric and independent movement of all extremities  Abdominal: non-tender, non-distended, no appreciable organomegaly, neg Murphy's, BS WNLx4  Neurological: Normal balance/coordination. No tremor.  Skin: warm, dry, intact.   Psychiatric: Normal judgment/insight. Normal mood and affect. Oriented x3.       Visit summary with medication list and pertinent instructions was printed for patient to review, patient was advised to alert us if any updates are needed. All questions at time of visit were answered - patient instructed to contact office with any additional concerns. ER/RTC precautions were reviewed with the patient and understanding verbalized.   Please note: voice recognition software was used to produce this document, and typos may escape review. Please contact Dr. Lyn HollingsheadAlexander for any needed clarifications.    Follow up plan: Return if symptoms worsen or fail to  improve. Otherwise 10/2018 for annual check-up.

## 2017-12-12 ENCOUNTER — Telehealth: Payer: Self-pay | Admitting: Osteopathic Medicine

## 2017-12-12 ENCOUNTER — Other Ambulatory Visit: Payer: Self-pay | Admitting: Osteopathic Medicine

## 2017-12-12 DIAGNOSIS — M899 Disorder of bone, unspecified: Secondary | ICD-10-CM

## 2017-12-12 MED ORDER — DENOSUMAB 60 MG/ML ~~LOC~~ SOSY: 60.0000 mg | PREFILLED_SYRINGE | SUBCUTANEOUS | 1 refills | Status: DC

## 2017-12-12 NOTE — Telephone Encounter (Signed)
-----   Message from Sunnie NielsenNatalie Alexander, DO sent at 12/12/2017 10:48 AM EST ----- Regarding: prolia! proliaaaaaa

## 2017-12-12 NOTE — Telephone Encounter (Signed)
prolia ordered. Routing for PA. Will need new labs, order placed.

## 2017-12-15 NOTE — Telephone Encounter (Signed)
Information has been faxed to insurance for Prolia and waiting on response.

## 2017-12-16 MED ORDER — ALENDRONATE SODIUM 70 MG PO TABS: 70.0000 mg | ORAL_TABLET | ORAL | 11 refills | Status: DC

## 2017-12-16 NOTE — Telephone Encounter (Signed)
Received a fax from [employer] that Prolia is covered for the patient and she would have to pay a portion out of her pocket. She does not want to do that at this time.  She is wanting to take a pill for her bones. Pharmacy on file is correct. Please advise.

## 2017-12-16 NOTE — Telephone Encounter (Signed)
Adair LaundrySent Foasmax to pharmacy!

## 2017-12-16 NOTE — Telephone Encounter (Signed)
Pt advised.

## 2017-12-16 NOTE — Addendum Note (Signed)
Addended by: Deirdre PippinsALEXANDER, Styles Fambro M on: 12/16/2017 04:22 PM   Modules accepted: Orders

## 2017-12-18 ENCOUNTER — Telehealth: Payer: Self-pay

## 2017-12-18 NOTE — Telephone Encounter (Signed)
Pt called - was informed by pharmacist that she will need to contact provider before taking fosamax rx due to pt's medical hx. Pt wanted to know if there would be any contradiction to taking the med or side effects. Pt is also requesting a statement of health regarding her environmental allergies (pet dander, dust, mold, etc). Pls advise, thanks.

## 2017-12-19 NOTE — Telephone Encounter (Signed)
As per pharmacist - does not recall speaking with pt about risks of taking med. Contacted pt, she was reading med information sheet that was given along with medication.

## 2017-12-19 NOTE — Telephone Encounter (Signed)
Can we call pharmacy and see what their concern was or what was communicated to the patient? If I'm missing something, I'd like to know!

## 2017-12-20 LAB — HM DEXA SCAN

## 2017-12-22 NOTE — Telephone Encounter (Signed)
I am not aware of any contraindications in her case. If she has specific question, can let me know!

## 2017-12-23 ENCOUNTER — Encounter: Payer: Self-pay | Admitting: Diagnostic Neuroimaging

## 2017-12-23 ENCOUNTER — Telehealth: Payer: Self-pay | Admitting: *Deleted

## 2017-12-23 ENCOUNTER — Ambulatory Visit (INDEPENDENT_AMBULATORY_CARE_PROVIDER_SITE_OTHER): Payer: Medicare Other | Admitting: Diagnostic Neuroimaging

## 2017-12-23 VITALS — BP 100/62 | HR 76 | Ht 65.0 in | Wt 132.0 lb

## 2017-12-23 DIAGNOSIS — R200 Anesthesia of skin: Secondary | ICD-10-CM

## 2017-12-23 NOTE — Telephone Encounter (Signed)
Patient was no show for new patient appointment today. 

## 2017-12-23 NOTE — Telephone Encounter (Signed)
Spoke with Pt, she no longer has concerns regarding this Rx. She has been taking it as prescribed, no complaints.   Pt is requesting a letter stating that she is allergic to pet dander. There has been some outbreaks of bed bugs in her apartment complex, and they use a dog to come into the apartment to search for them. This letter will allow them to come in to check for bed bugs, but not use the dog.

## 2017-12-23 NOTE — Telephone Encounter (Signed)
Lovely. Printed to mail

## 2017-12-23 NOTE — Telephone Encounter (Signed)
Pt would like that letter mailed to her. Address on file correct.

## 2017-12-23 NOTE — Telephone Encounter (Signed)
Patient arrived 20 minutes late for new patient appointment and stated she was rerouted due to road construction and got lost. Patient was seen today by Dr Marjory LiesPenumalli.

## 2017-12-23 NOTE — Progress Notes (Signed)
[facility]  PATIENT: Latoya FritzLinda Cummings DOB: 04/09/1945  REFERRING CLINICIAN: Janalyn RouseShaili Deveshwar HISTORY FROM: patient  REASON FOR VISIT: new consult    HISTORICAL  CHIEF COMPLAINT:  Chief Complaint  Patient presents with  . Neuropathy    rm 7, New Pt, "numb in palm and fingertips of hands, feet with blue color around the big toe and toenail"    HISTORY OF PRESENT ILLNESS:   72 year old female here for evaluation of numbness and tingling and color changes in the fingers and hands.  Patient has a complex medical history with multiple evaluations by rheumatology and neurology in the past.  Initially she was diagnosed with possible Sjogren's syndrome and sicca symptoms, treated with Plaquenil.  Apparently she had abnormal lab testing in the past although I do not see those results today.  She then has seen at least 3 rheumatologists who have done comprehensive evaluations with negative serology testing since that time.  Therefore the diagnosis of Sjogren's syndrome or other autoimmune condition has been questioned and reversed.  Patient continues to have change in the color of her fingertips which she reports as changing blue and white in color especially when she is in cold situations.  Patient also having numbness and tingling in the fingertips, hands, feet.  Also with fatigue, blurred vision, joint pain, easy bruising, numbness, disinterest in activities, sun sensitivity.    REVIEW OF SYSTEMS: Full 14 system review of systems performed and negative with exception of: As per HPI.  ALLERGIES: Allergies  Allergen Reactions  . Aspirin Other (See Comments)    Severe pain Gastritis "ASA has to be coated"  . Moxifloxacin Other (See Comments)    Turned eye black, jaw pain, rash  . Tape Rash  . Tuberculin Ppd Rash    HOME MEDICATIONS: Outpatient Medications Prior to Visit  Medication Sig Dispense Refill  . alendronate (FOSAMAX) 70 MG tablet Take 1 tablet (70 mg  total) by mouth every 7 (seven) days. 4 tablet 11  . B Complex Vitamins (B COMPLEX PO) Take by mouth daily.    . Cyanocobalamin (VITAMIN B-12 PO) Take by mouth daily.    . fluticasone (FLONASE) 50 MCG/ACT nasal spray USE 2 SPRAYS BY NASAL ROUTE DAILY. 48 g 3  . gabapentin (NEURONTIN) 800 MG tablet TAKE 1 TABLET IN THE MORNING AND TAKE 2 TABLETS AT BEDTIME 270 tablet 0  . GINKGO BILOBA PO Take by mouth.    . pantoprazole (PROTONIX) 40 MG tablet Take 40 mg by mouth daily.     Marland Kitchen. pyridOXINE (VITAMIN B-6) 100 MG tablet Take by mouth daily.    Marland Kitchen. ibuprofen (ADVIL,MOTRIN) 800 MG tablet Take 1 tablet (800 mg total) by mouth every 8 (eight) hours as needed. (Patient not taking: Reported on 12/23/2017) 30 tablet 1  . Omega-3 Fatty Acids (FISH OIL PO) Take by mouth as needed.     Marland Kitchen. denosumab (PROLIA) 60 MG/ML SOSY injection Inject 60 mg into the skin every 6 (six) months. Administer in upper arm, thigh, or abdomen 1 Syringe 1   No facility-administered medications prior to visit.     PAST MEDICAL HISTORY: Past Medical History:  Diagnosis Date  . Back pain   . Cataract   . Depression   . Depression   . GERD (gastroesophageal reflux disease)   . Hepatitis C 12/07/2016  . Hyperlipidemia   . Radicular pain   . Sjogren's disease (HCC)   . Sjogren's disease (HCC)     PAST SURGICAL HISTORY: Past Surgical  History:  Procedure Laterality Date  . ABDOMINAL HYSTERECTOMY      FAMILY HISTORY: Family History  Problem Relation Age of Onset  . Cancer Mother   . Asthma Brother   . Sickle cell anemia Brother   . Cancer Brother   . Lupus Brother     SOCIAL HISTORY: Social History   Socioeconomic History  . Marital status: Single    Spouse name: Not on file  . Number of children: 0  . Years of education: 7  . Highest education level: Some college, no degree  Occupational History    Comment: retired  Engineer, production  . Financial resource strain: Not on file  . Food insecurity:    Worry: Not  on file    Inability: Not on file  . Transportation needs:    Medical: Not on file    Non-medical: Not on file  Tobacco Use  . Smoking status: Former Smoker    Types: Cigarettes    Last attempt to quit: 1999    Years since quitting: 20.9  . Smokeless tobacco: Never Used  Substance and Sexual Activity  . Alcohol use: No    Comment: quit 1999  . Drug use: No  . Sexual activity: Never  Lifestyle  . Physical activity:    Days per week: 0 days    Minutes per session: 0 min  . Stress: Not on file  Relationships  . Social connections:    Talks on phone: Not on file    Gets together: Not on file    Attends religious service: Not on file    Active member of club or organization: Not on file    Attends meetings of clubs or organizations: Not on file    Relationship status: Not on file  . Intimate partner violence:    Fear of current or ex partner: Not on file    Emotionally abused: Not on file    Physically abused: Not on file    Forced sexual activity: Not on file  Other Topics Concern  . Not on file  Social History Narrative   Lives alone   Caffeine- 1 cup daily     PHYSICAL EXAM  GENERAL EXAM/CONSTITUTIONAL: Vitals:  Vitals:   12/23/17 1329  BP: 100/62  Pulse: 76  Weight: 132 lb (59.9 kg)  Height: 5\' 5"  (1.651 m)     Body mass index is 21.97 kg/m. Wt Readings from Last 3 Encounters:  12/23/17 132 lb (59.9 kg)  12/10/17 132 lb 4.8 oz (60 kg)  11/14/17 133 lb (60.3 kg)     Patient is in no distress; well developed, nourished and groomed; neck is supple  CARDIOVASCULAR:  Examination of carotid arteries is normal; no carotid bruits  Regular rate and rhythm, no murmurs  Examination of peripheral vascular system by observation and palpation is normal  EYES:  Ophthalmoscopic exam of optic discs and posterior segments is normal; no papilledema or hemorrhages  Visual Acuity Screening   Right eye Left eye Both eyes  Without correction:     With  correction: 20/50    Comments: Bifocals, unable with left eye    MUSCULOSKELETAL:  Gait, strength, tone, movements noted in Neurologic exam below  NEUROLOGIC: MENTAL STATUS:  MMSE - Mini Mental State Exam 02/13/2017  Orientation to time 5  Orientation to Place 5  Registration 3  Attention/ Calculation 5  Recall 1  Language- name 2 objects 2  Language- repeat 1  Language- follow 3 step command 3  Language- read & follow direction 1  Write a sentence 1  Copy design 1  Total score 28    awake, alert, oriented to person, place and time  recent and remote memory intact  normal attention and concentration  language fluent, comprehension intact, naming intact  fund of knowledge appropriate  CRANIAL NERVE:   2nd - no papilledema on fundoscopic exam  2nd, 3rd, 4th, 6th - pupils equal and reactive to light, visual fields full to confrontation, extraocular muscles intact, no nystagmus  5th - facial sensation symmetric  7th - facial strength symmetric  8th - hearing intact  9th - palate elevates symmetrically, uvula midline  11th - shoulder shrug symmetric  12th - tongue protrusion midline  MOTOR:   normal bulk and tone, full strength in the BUE, BLE  SENSORY:   normal and symmetric to light touch, pinprick, temperature, vibration  COORDINATION:   finger-nose-finger, fine finger movements normal  REFLEXES:   deep tendon reflexes present and symmetric; TRACE AT ANKLES  GAIT/STATION:   narrow based gait; able to walk tandem; romberg is negative     DIAGNOSTIC DATA (LABS, IMAGING, TESTING) - I reviewed patient records, labs, notes, testing and imaging myself where available.  Lab Results  Component Value Date   WBC 7.1 11/14/2017   HGB 12.9 11/14/2017   HCT 38.0 11/14/2017   MCV 89.0 11/14/2017   PLT 213 11/14/2017      Component Value Date/Time   NA 139 11/14/2017 1411   K 4.3 11/14/2017 1411   CL 102 11/14/2017 1411   CO2 32 11/14/2017  1411   GLUCOSE 84 11/14/2017 1411   BUN 12 11/14/2017 1411   CREATININE 0.74 11/14/2017 1411   CALCIUM 9.9 11/14/2017 1411   PROT 7.3 10/17/2017 0944   PROT 7.6 10/17/2017 0944   ALBUMIN 4.5 09/17/2016 1004   AST 23 10/17/2017 0944   ALT 9 10/17/2017 0944   ALKPHOS 62 09/17/2016 1004   BILITOT 0.4 10/17/2017 0944   GFRNONAA 88 10/17/2017 0944   GFRAA 102 10/17/2017 0944   Lab Results  Component Value Date   CHOL 171 09/17/2016   HDL 53.30 09/17/2016   LDLCALC 96 09/17/2016   TRIG 111.0 09/17/2016   CHOLHDL 3 09/17/2016   No results found for: HGBA1C No results found for: VITAMINB12 Lab Results  Component Value Date   TSH 1.20 10/17/2017    01/14/11 MRI brain  1. No acute intracranial abnormality. 2. Mild periventricular and scattered subcortical white matter T2/FLAIR hyperintensities bilaterally are nonspecific, but can most often be seen in the setting of chronic microvascular disease.  01/14/11 MRI cervical spine - There is a central disc herniation at C5-C6 which effaces the CSF and slightly compresses the ventral aspect of the spinal cord. No cord signal abnormality.  06/11/16 MRI lumbar spine - Multilevel lumbar spondylosis with central and lateral recess stenosis that is mild at L3-L4 and mild to moderate at L4-L5. Disc bulge at L3-L4 with a small superimposed right foraminal disc herniation, but no substantial foraminal stenosis. - Small right S2-S3 sacral Tarlov cyst mildly deforms the adjacent S2 nerve root. - Borderline low position of the conus ending at the inferior L2 endplate, but otherwise normal signal and appearance of the distal cord and conus.    ASSESSMENT AND PLAN  72 y.o. year old female here with numbness and tingling in hands and feet, chronic pain, low energy, sicca symptoms, Raynaud's phenomena, depression, anxiety.  She has had multiple rheumatology and neurology evaluations  with negative work-up.   Dx:  1. Numbness     PLAN:  INTERMITTENT  NUMBNESS / CHRONIC PAIN / LOW ENERGY (negative workup by neurology in the past; [facility]; apparently had negative skin biopsy; had EMG, but results not available; history of alcohol abuse; history of major depression; neurologic exam normal today) - check B12 level (history of poor nutrition) - continue gabapentin; consider duloxetine; consider pain mgmt clinic  SICCA symptoms / RAYNAUD'S PHENOMENON (prior dx of sjogren's syndrome but has been reveresed; previously on plaquenil, but then stopped; follow up testing and eval by rheumatology x 3 have been negative; Has seen Dr. Artis Flock [facility] Dr. Nickola Major, Dr. Corliss Skains) - follow up with PCP  DEPRESSION / ANXIETY  - under control per patient; follow up with PCP  Orders Placed This Encounter  Procedures  . Vitamin B12   Return if symptoms worsen or fail to improve, for return to PCP.    Suanne Marker, MD 12/23/2017, 1:43 PM Certified in Neurology, Neurophysiology and Neuroimaging  [facility] [address], [address] [place], [place] [address] [phone]

## 2017-12-24 LAB — VITAMIN B12: Vitamin B-12: 1818 pg/mL — ABNORMAL HIGH (ref 232–1245)

## 2017-12-24 NOTE — Telephone Encounter (Signed)
Task completed. Letter mailed on 12/23/17.

## 2017-12-25 ENCOUNTER — Encounter: Payer: Self-pay | Admitting: Osteopathic Medicine

## 2017-12-26 ENCOUNTER — Telehealth: Payer: Self-pay | Admitting: *Deleted

## 2017-12-26 NOTE — Telephone Encounter (Signed)
LVM informing the patient her Vit B12 lab is normal. Left office number.

## 2017-12-29 ENCOUNTER — Telehealth: Payer: Self-pay | Admitting: Diagnostic Neuroimaging

## 2017-12-29 ENCOUNTER — Telehealth: Payer: Self-pay

## 2017-12-29 NOTE — Telephone Encounter (Signed)
Called the patient back to discuss what concerns she had. Patient was asking if a letter could be written on her behalf to talk about the allergies that she has. I informed her that in the office visit here we primarily focused on discussion of numbness she was having. Advised the patient to contact the PCP to discuss letters about her general healthcare. Pt verbalized understanding. Pt had no questions at this time but was encouraged to call back if questions arise.

## 2017-12-29 NOTE — Telephone Encounter (Signed)
Pt is asking for a call from RN to discuss paperwork she needs sent to her PCP's office

## 2017-12-29 NOTE — Telephone Encounter (Signed)
Latoya QuinLinda wants a letter from Dr Latoya Cummings stating she has allergies to pets, environmental and seasonal.   Latoya FritzLinda Cummings has fragrance sensitivity and it displays as an irritation or an allergic reaction to ingredients or chemicals in fragrances. She is also allergic to the scents in a variety of daily-use items like toiletries, cosmetics, cleaning products and pesticides. She has pet, seasonal and environmental allergies.

## 2017-12-30 ENCOUNTER — Other Ambulatory Visit: Payer: Self-pay | Admitting: Osteopathic Medicine

## 2017-12-30 NOTE — Telephone Encounter (Signed)
Requested Prescriptions  Pending Prescriptions Disp Refills  . gabapentin (NEURONTIN) 800 MG tablet [Pharmacy Med Name: GABAPENTIN 800 MG TABLET] 270 tablet 0    Sig: TAKE 1 TABLET IN THE MORNING AND TAKE 2 TABLETS AT BEDTIME     Neurology: Anticonvulsants - gabapentin Passed - 12/30/2017  2:35 AM      Passed - Valid encounter within last 12 months    Recent Outpatient Visits          2 weeks ago Sensation of pressure in bladder area   [facility] [place] [place], Dorene GrebeNatalie, DO   1 month ago Temporal pain   [facility] [place] Cummings, Bernerd Phoharley Elizabeth, PA-C   2 months ago Annual physical exam   [facility] [place] Alexander, Dorene GrebeNatalie, DO   8 months ago Chronic low back pain, unspecified back pain laterality, with sciatica presence unspecified   [facility]  [place], [place], DO   11 months ago [employer] [facility] Executive [employer]   [facility] [place], [place], VF CorporationPA-C      Future Appointments            In 1 month Moshe CiproBritt, Philis KendallVictoria A, G[facility], [place]

## 2018-01-02 NOTE — Telephone Encounter (Signed)
If an employer or other entity requests this information, I need to know if there are specific forms such sa FMLA or other that would require an office visit to address, and need to know specifically why the patient is requesting these (accomodation for work, etc). Please call and see who needs these forms and why.

## 2018-01-02 NOTE — Telephone Encounter (Addendum)
Spoke to pt - she has received the allergy letter mailed on 12/23/17 from provider. As per pt, she does not need any add'l information noted on letter. She thanks provider for their time.

## 2018-01-02 NOTE — Telephone Encounter (Signed)
Left a message for a return call.

## 2018-01-05 DIAGNOSIS — L814 Other melanin hyperpigmentation: Secondary | ICD-10-CM | POA: Diagnosis not present

## 2018-01-05 DIAGNOSIS — L821 Other seborrheic keratosis: Secondary | ICD-10-CM | POA: Diagnosis not present

## 2018-01-05 DIAGNOSIS — L579 Skin changes due to chronic exposure to nonionizing radiation, unspecified: Secondary | ICD-10-CM | POA: Diagnosis not present

## 2018-01-19 DIAGNOSIS — H02411 Mechanical ptosis of right eyelid: Secondary | ICD-10-CM | POA: Diagnosis not present

## 2018-01-19 DIAGNOSIS — H04123 Dry eye syndrome of bilateral lacrimal glands: Secondary | ICD-10-CM | POA: Diagnosis not present

## 2018-01-19 DIAGNOSIS — H02412 Mechanical ptosis of left eyelid: Secondary | ICD-10-CM | POA: Diagnosis not present

## 2018-01-28 ENCOUNTER — Telehealth: Payer: Self-pay

## 2018-01-28 DIAGNOSIS — Z789 Other specified health status: Secondary | ICD-10-CM

## 2018-01-28 NOTE — Telephone Encounter (Signed)
Pt called requesting new referrals for psychiatrist and neurologist. Pls advise, thanks.

## 2018-01-29 NOTE — Telephone Encounter (Signed)
Cindy I see one in already for neuro.  Can we find out what she needs to see Psych for? Meds or counseling?   Let me know if need anything from me!

## 2018-01-29 NOTE — Telephone Encounter (Signed)
Spoke with Pt. She reports she left the name and phone number of the psychiatrist in the voicemail yesterday. 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 This would be one referral, not two.    Dr Arville Go, phone [phone].  [email or web address]

## 2018-01-30 NOTE — Telephone Encounter (Signed)
...    Referral is in, if there are any other issues or if the neurology office contacts [place] concerned about an inappropriate referral, the patient is going to have to make a visit with me to figure it out.

## 2018-01-30 NOTE — Telephone Encounter (Signed)
Pt advised. Understands and is agreeable that she will need appt if any issue with referral. No further needs at this time.

## 2018-01-30 NOTE — Telephone Encounter (Signed)
Dr August Saucer is a neurologist.   Not sure why the patient would be giving [place] information for a neurologist if she is wanting to be seen for seasonal depression.  I think she means psychiatrist.   Can we please call the patient back and confirm the below information? If she has the name of a psychiatrist or can tell me what she needs to see a neurologist for, that would be helpful...   If we cannot get anywhere with this the patient is just going to have to make an appointment with me to sort this out in person

## 2018-01-30 NOTE — Telephone Encounter (Signed)
Spoke with Pt, she has already talked to the office of the requested physician. 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 Reports she has already began the application process to see this Physician and now just needs a referral. Pt does not understand why she has to see PCP again to get a referral. Will route.

## 2018-01-30 NOTE — Telephone Encounter (Signed)
Done

## 2018-01-30 NOTE — Telephone Encounter (Signed)
   Dr Arville Gohristine Dean, phone [phone].  [email or web address]

## 2018-02-06 ENCOUNTER — Telehealth: Payer: Self-pay | Admitting: Rheumatology

## 2018-02-06 NOTE — Telephone Encounter (Signed)
Patient called requesting to speak with Dr. Corliss Skains or Ladona Ridgel directly.

## 2018-02-09 NOTE — Telephone Encounter (Signed)
Patient states she is in needs to have some forms that need to be filled out and needs to schedule and appointment to discuss if this can be done. Will call back tomorrow to schedule.

## 2018-02-16 ENCOUNTER — Ambulatory Visit: Payer: Self-pay | Admitting: *Deleted

## 2018-03-04 DIAGNOSIS — H04123 Dry eye syndrome of bilateral lacrimal glands: Secondary | ICD-10-CM | POA: Diagnosis not present

## 2018-03-04 DIAGNOSIS — Z961 Presence of intraocular lens: Secondary | ICD-10-CM | POA: Diagnosis not present

## 2018-03-04 DIAGNOSIS — H2511 Age-related nuclear cataract, right eye: Secondary | ICD-10-CM | POA: Diagnosis not present

## 2018-03-17 ENCOUNTER — Telehealth: Payer: Self-pay

## 2018-03-17 NOTE — Telephone Encounter (Signed)
Left a detailed vm msg for pt regarding provider's note. Direct call back info provided.  

## 2018-03-17 NOTE — Telephone Encounter (Signed)
Pt called requesting a referral for MRI. As per pt, she has an upcoming appt for 03/22/18. She was informed that provider will need to place an order. Pls advise, thanks.

## 2018-03-17 NOTE — Telephone Encounter (Signed)
I did not mention anything about needing an MRI at my previous visit.  She will have to wait until her follow-up to discuss this more.  Unless one of her specialists mentioned this?  Either way, I am not ordering anything until I see her

## 2018-03-20 DIAGNOSIS — M6240 Contracture of muscle, unspecified site: Secondary | ICD-10-CM | POA: Diagnosis not present

## 2018-03-20 DIAGNOSIS — M9903 Segmental and somatic dysfunction of lumbar region: Secondary | ICD-10-CM | POA: Diagnosis not present

## 2018-03-27 ENCOUNTER — Telehealth: Payer: Self-pay | Admitting: Osteopathic Medicine

## 2018-03-27 NOTE — Telephone Encounter (Signed)
Received request from her chiropractor's office that patient needed a handicap sticker renewed.  I filled it out, we will mail it to her.  Placed in Latoya Cummings's in basket

## 2018-03-27 NOTE — Telephone Encounter (Signed)
Left vm msg for pt, aware that form will be mailed out to address on file. Direct call back info provided.

## 2018-03-29 ENCOUNTER — Other Ambulatory Visit: Payer: Self-pay | Admitting: Osteopathic Medicine

## 2018-04-06 ENCOUNTER — Telehealth: Payer: Self-pay | Admitting: Rheumatology

## 2018-04-06 NOTE — Telephone Encounter (Signed)
Patient called stating "she has a personal issue and needs to speak with Dr. Corliss Skains directly."  Patient requested a return call.

## 2018-04-07 ENCOUNTER — Telehealth: Payer: Self-pay | Admitting: Rheumatology

## 2018-04-07 DIAGNOSIS — I7300 Raynaud's syndrome without gangrene: Secondary | ICD-10-CM

## 2018-04-07 DIAGNOSIS — M25561 Pain in right knee: Secondary | ICD-10-CM

## 2018-04-07 DIAGNOSIS — M3500 Sicca syndrome, unspecified: Secondary | ICD-10-CM

## 2018-04-07 DIAGNOSIS — M25562 Pain in left knee: Secondary | ICD-10-CM

## 2018-04-07 DIAGNOSIS — G8929 Other chronic pain: Secondary | ICD-10-CM

## 2018-04-07 DIAGNOSIS — R5383 Other fatigue: Secondary | ICD-10-CM

## 2018-04-07 NOTE — Telephone Encounter (Signed)
Dr. Hedwig Morton

## 2018-04-07 NOTE — Telephone Encounter (Signed)
See previous phone note.  

## 2018-04-07 NOTE — Telephone Encounter (Signed)
Patient called stating she had transportation issues and cancelled her NPT FU appointment in November and wanted to apologize to Dr. Corliss Skains personally.  Patient states she lives in [place] and is asking if Dr. Corliss Skains would be able to recommend a Rheumatologist in her area.  Patient requested a return call.

## 2018-04-07 NOTE — Telephone Encounter (Signed)
Referral placed.Left message to advised patient referral has been placed.

## 2018-04-20 ENCOUNTER — Telehealth: Payer: Self-pay | Admitting: Rheumatology

## 2018-04-20 NOTE — Telephone Encounter (Signed)
Patient called stating Dr. Corliss Skains referred her to Dr. Ancil Linsey, but due to the Coronavirus is unable to schedule an appointment at this time.  Patient states she "has information she would like to share with Dr. Corliss Skains" and requested a return call.

## 2018-04-20 NOTE — Telephone Encounter (Signed)
Patient states she is needing an accommodation form completed for school since she has raynauds and is unable to type in a cold room. Patient will mail the form to our office. Patient states she would have Dr. Ancil Linsey complete the form but it will be a while before she can schedule a new patient appointment.

## 2018-05-06 DIAGNOSIS — M797 Fibromyalgia: Secondary | ICD-10-CM | POA: Diagnosis not present

## 2018-05-07 DIAGNOSIS — R1084 Generalized abdominal pain: Secondary | ICD-10-CM | POA: Diagnosis not present

## 2018-05-07 DIAGNOSIS — K5900 Constipation, unspecified: Secondary | ICD-10-CM | POA: Diagnosis not present

## 2018-05-07 DIAGNOSIS — K219 Gastro-esophageal reflux disease without esophagitis: Secondary | ICD-10-CM | POA: Diagnosis not present

## 2018-05-13 ENCOUNTER — Other Ambulatory Visit: Payer: Self-pay | Admitting: Medical

## 2018-05-19 ENCOUNTER — Telehealth: Payer: Self-pay

## 2018-05-19 NOTE — Telephone Encounter (Signed)
[facility] ibuprofen 800 mg. Rx written by external provider.

## 2018-05-20 MED ORDER — IBUPROFEN 800 MG PO TABS: 800.0000 mg | ORAL_TABLET | Freq: Three times a day (TID) | ORAL | 1 refills | Status: AC | PRN

## 2018-05-20 NOTE — Telephone Encounter (Signed)
Done

## 2018-06-03 DIAGNOSIS — H524 Presbyopia: Secondary | ICD-10-CM | POA: Diagnosis not present

## 2018-06-05 DIAGNOSIS — M797 Fibromyalgia: Secondary | ICD-10-CM | POA: Diagnosis not present

## 2018-06-24 ENCOUNTER — Telehealth: Payer: Self-pay | Admitting: Osteopathic Medicine

## 2018-06-24 NOTE — Telephone Encounter (Signed)
Patient does not want to go forward with the Prolia at this time and she will start this at a later time. Patient reports that she will speak with PCP at upcoming appointment and discuss starting the medication. Patient was given estimate and she reports that she has not had to pay for an office visit in the past.

## 2018-06-29 DIAGNOSIS — R140 Abdominal distension (gaseous): Secondary | ICD-10-CM | POA: Diagnosis not present

## 2018-06-29 DIAGNOSIS — K219 Gastro-esophageal reflux disease without esophagitis: Secondary | ICD-10-CM | POA: Diagnosis not present

## 2018-06-29 DIAGNOSIS — K5900 Constipation, unspecified: Secondary | ICD-10-CM | POA: Diagnosis not present

## 2018-07-02 ENCOUNTER — Other Ambulatory Visit: Payer: Self-pay | Admitting: Osteopathic Medicine

## 2018-07-02 NOTE — Telephone Encounter (Signed)
Forwarding medication refill to PCP for review. 

## 2018-08-14 ENCOUNTER — Other Ambulatory Visit: Payer: Self-pay

## 2018-08-18 ENCOUNTER — Encounter: Payer: Self-pay | Admitting: Osteopathic Medicine

## 2018-08-18 ENCOUNTER — Ambulatory Visit (INDEPENDENT_AMBULATORY_CARE_PROVIDER_SITE_OTHER): Payer: Medicare Other | Admitting: Osteopathic Medicine

## 2018-08-18 VITALS — BP 108/60 | HR 80 | Temp 97.9°F | Wt 125.0 lb

## 2018-08-18 DIAGNOSIS — E739 Lactose intolerance, unspecified: Secondary | ICD-10-CM | POA: Diagnosis not present

## 2018-08-18 DIAGNOSIS — Z8659 Personal history of other mental and behavioral disorders: Secondary | ICD-10-CM | POA: Diagnosis not present

## 2018-08-18 DIAGNOSIS — M810 Age-related osteoporosis without current pathological fracture: Secondary | ICD-10-CM

## 2018-08-18 NOTE — Progress Notes (Signed)
Virtual Visit via Phone I connected with      Latoya Cummings on 08/18/18 at 3:30 PM by a telemedicine application and verified that I am speaking with the correct person using two identifiers.  Patient is at home I am in office   I discussed the limitations of evaluation and management by telemedicine and the availability of in person appointments. The patient expressed understanding and agreed to proceed.  History of Present Illness: Latoya Cummings is a 73 y.o. female who would like to discuss Prolia  Patient is very frustrated, stating that she has called our office on multiple occasions but accuses us of not answering her phone calls when we see her number due to us being racist.  She states that she has had some side effects from the Fosamax, including muscle aches, and she would like to try the Prolia injections for osteoporosis.  Based on most recent review of records on this issue, she told our staff that she did not want to pursue injections but would talk to me about it another time.  This is the first that I am really hearing of it.  Patient also has concerns about persistent abdominal pain and bloating.  She has seen GI for this issue.  Most recent records were reviewed, she continues to consume dairy without supplemental enzymes.   Patient states that she is also following up again with behavioral health.  Has restarted Zoloft and Xanax.       Observations/Objective: BP 108/60 (BP Location: Left Arm, Patient Position: Sitting, Cuff Size: Normal)   Pulse 80   Temp 97.9 F (36.6 C) (Oral)   Wt 125 lb (56.7 kg)   BMI 20.80 kg/m  BP Readings from Last 3 Encounters:  08/18/18 108/60  12/23/17 100/62  12/10/17 (!) 129/44   Exam: Disorganized and pressured speech.    Lab and Radiology Results No results found for this or any previous visit (from the past 72 hour(s)). No results found.     Assessment and Plan: 73 y.o. female with The primary encounter diagnosis  was Age-related osteoporosis without current pathological fracture. Diagnoses of Lactose intolerance and History of mood disorder were also pertinent to this visit.  I informed patient that I did not have any documentation of her attempting any calls other than the one that most recently was documented in June.  I am okay to go ahead and try to get the Prolia covered.  Patient was advised that this might have to go through an approval process and may take a couple of weeks.  PDMP not reviewed this encounter. No orders of the defined types were placed in this encounter.  Meds ordered this encounter  Medications  . denosumab (PROLIA) 60 MG/ML SOSY injection    Sig: Inject 60 mg into the skin every 6 (six) months. Administer in upper arm, thigh, or abdomen      Follow Up Instructions: Return in about 3 months (around 11/18/2018) for ANNUAL (call week prior to visit for lab orders).    I discussed the assessment and treatment plan with the patient. The patient was provided an opportunity to ask questions and all were answered. The patient agreed with the plan and demonstrated an understanding of the instructions.   The patient was advised to call back or seek an in-person evaluation if any new concerns, if symptoms worsen or if the condition fails to improve as anticipated.  40 minutes of non-face-to-face time was provided during this encounter.  Historical information moved to improve visibility of documentation.  Past Medical History:  Diagnosis Date  . Back pain   . Cataract   . Depression   . Depression   . GERD (gastroesophageal reflux disease)   . Hepatitis C 12/07/2016  . Hyperlipidemia   . Radicular pain   . Sjogren's disease ([place])   . Sjogren's disease [facility])    Past Surgical History:  Procedure Laterality Date  . ABDOMINAL HYSTERECTOMY     Social History   Tobacco Use  . Smoking status: Former Smoker    Types: Cigarettes     Quit date: 1999    Years since quitting: 21.6  . Smokeless tobacco: Never Used  Substance Use Topics  . Alcohol use: No    Comment: quit 1999   family history includes Asthma in her brother; Cancer in her brother and mother; Lupus in her brother; Sickle cell anemia in her brother.  Medications: Current Outpatient Medications  Medication Sig Dispense Refill  . alendronate (FOSAMAX) 70 MG tablet Take 1 tablet (70 mg total) by mouth every 7 (seven) days. 4 tablet 11  . B Complex Vitamins (B COMPLEX PO) Take by mouth daily.    . fluticasone (FLONASE) 50 MCG/ACT nasal spray USE 2 SPRAYS BY NASAL ROUTE DAILY. 48 g 3  . gabapentin (NEURONTIN) 800 MG tablet TAKE 1 TABLET IN THE MORNING AND TAKE 2 TABLETS AT BEDTIME 270 tablet 0  . GINKGO BILOBA PO Take by mouth.    Marland Kitchen ibuprofen (ADVIL) 800 MG tablet Take 1 tablet (800 mg total) by mouth every 8 (eight) hours as needed. 30 tablet 1  . Omega-3 Fatty Acids (FISH OIL PO) Take by mouth as needed.     . pantoprazole (PROTONIX) 40 MG tablet Take 40 mg by mouth daily.     . Cyanocobalamin (VITAMIN B-12 PO) Take by mouth daily.    Marland Kitchen pyridOXINE (VITAMIN B-6) 100 MG tablet Take by mouth daily.     No current facility-administered medications for this visit.    Allergies  Allergen Reactions  . Aspirin Other (See Comments)    Severe pain Gastritis "ASA has to be coated"  . Tuberculin Rash  . Moxifloxacin Other (See Comments)    Turned eye black, jaw pain, rash  . Tape Rash  . Tuberculin Ppd Rash    PDMP not reviewed this encounter. No orders of the defined types were placed in this encounter.  No orders of the defined types were placed in this encounter.

## 2018-08-19 ENCOUNTER — Telehealth: Payer: Self-pay

## 2018-08-19 DIAGNOSIS — M3500 Sicca syndrome, unspecified: Secondary | ICD-10-CM

## 2018-08-19 MED ORDER — DENOSUMAB 60 MG/ML ~~LOC~~ SOSY: 60.0000 mg | PREFILLED_SYRINGE | SUBCUTANEOUS | Status: AC

## 2018-08-19 NOTE — Telephone Encounter (Signed)
Referral placed.

## 2018-08-19 NOTE — Telephone Encounter (Signed)
Pt called stating she forgot to ask during her appt a referral for ENT specialist. As per pt, the specialist she was seeing has left the facility where she was going and they are not taking new patients at this time. Pt did not state a location preference.

## 2018-08-20 NOTE — Telephone Encounter (Signed)
Pt has been updated of referral for ENT specialist. Aware to return a call back if specialist office does not contact her for scheduling within a week. No other inquiries during call.

## 2018-08-28 ENCOUNTER — Telehealth: Payer: Self-pay | Admitting: Osteopathic Medicine

## 2018-08-28 DIAGNOSIS — J349 Unspecified disorder of nose and nasal sinuses: Secondary | ICD-10-CM

## 2018-08-28 NOTE — Telephone Encounter (Signed)
I see that you recently placed a referral to ENT and the patient has been to [facility] before and was not satisfied with the lack of care she was given at the visit. Can you place another referral to [facility] per this patient request. Please advise.

## 2018-08-28 NOTE — Telephone Encounter (Signed)
Done

## 2018-10-07 DIAGNOSIS — Z1159 Encounter for screening for other viral diseases: Secondary | ICD-10-CM | POA: Diagnosis not present

## 2018-10-07 DIAGNOSIS — Z8673 Personal history of transient ischemic attack (TIA), and cerebral infarction without residual deficits: Secondary | ICD-10-CM | POA: Diagnosis not present

## 2018-10-07 DIAGNOSIS — M810 Age-related osteoporosis without current pathological fracture: Secondary | ICD-10-CM | POA: Diagnosis not present

## 2018-10-07 DIAGNOSIS — M3500 Sicca syndrome, unspecified: Secondary | ICD-10-CM | POA: Diagnosis not present

## 2018-10-07 DIAGNOSIS — K219 Gastro-esophageal reflux disease without esophagitis: Secondary | ICD-10-CM | POA: Diagnosis not present

## 2018-10-07 DIAGNOSIS — Z79899 Other long term (current) drug therapy: Secondary | ICD-10-CM | POA: Diagnosis not present

## 2018-10-12 ENCOUNTER — Other Ambulatory Visit: Payer: Self-pay | Admitting: Osteopathic Medicine

## 2018-10-12 DIAGNOSIS — Z1231 Encounter for screening mammogram for malignant neoplasm of breast: Secondary | ICD-10-CM | POA: Diagnosis not present

## 2018-10-12 LAB — HM MAMMOGRAPHY

## 2018-10-29 DIAGNOSIS — M810 Age-related osteoporosis without current pathological fracture: Secondary | ICD-10-CM | POA: Diagnosis not present

## 2018-10-29 DIAGNOSIS — Z79899 Other long term (current) drug therapy: Secondary | ICD-10-CM | POA: Diagnosis not present

## 2018-10-29 DIAGNOSIS — Z0001 Encounter for general adult medical examination with abnormal findings: Secondary | ICD-10-CM | POA: Diagnosis not present

## 2018-11-02 ENCOUNTER — Other Ambulatory Visit: Payer: Self-pay | Admitting: Osteopathic Medicine

## 2018-11-02 NOTE — Telephone Encounter (Signed)
[facility]. Called pt to verify current osteoporosis med. Pt ended call abruptly on their end twice. Unable to contact pt. Pls advise if refill request is appropriate. Thanks.

## 2018-11-19 ENCOUNTER — Encounter: Payer: Self-pay | Admitting: Osteopathic Medicine

## 2019-02-04 ENCOUNTER — Telehealth: Payer: Self-pay | Admitting: Osteopathic Medicine

## 2019-02-04 NOTE — Telephone Encounter (Signed)
Noted  

## 2019-02-04 NOTE — Telephone Encounter (Signed)
I called the patient and she reports that she is no longer a patient and she is not dealing with "Dr. Mardelle Matte foolishness and her assistant as well. She requested to be taken off from Dr. Lyn Hollingshead as PCP". I asked her about the prolia for her bones and she was like I am not doing this and hung the phone up abruptly. No other questions. FYI

## 2019-02-15 ENCOUNTER — Encounter: Payer: Self-pay | Admitting: Family Medicine

## 2019-02-15 ENCOUNTER — Ambulatory Visit (INDEPENDENT_AMBULATORY_CARE_PROVIDER_SITE_OTHER): Payer: Medicare Other | Admitting: Family Medicine

## 2019-02-15 ENCOUNTER — Ambulatory Visit: Payer: Self-pay

## 2019-02-15 ENCOUNTER — Other Ambulatory Visit: Payer: Self-pay

## 2019-02-15 DIAGNOSIS — M79671 Pain in right foot: Secondary | ICD-10-CM

## 2019-02-15 MED ORDER — DICLOFENAC SODIUM 1 % EX GEL: 4.0000 g | Freq: Four times a day (QID) | CUTANEOUS | 6 refills | Status: AC | PRN

## 2019-02-15 NOTE — Progress Notes (Signed)
Office Visit Note   Patient: Latoya Cummings           Date of Birth: 1945-02-17           MRN: [id number] Visit Date: 02/15/2019 Requested by: No referring provider defined for this encounter. PCP: No primary care provider on file.  Subjective: Chief Complaint  Patient presents with  . Right Foot - Pain    Injured foot on 01/08/19, when she quickly stood up after both legs had fallen asleep. She said her foot had bent back under, and she was walking on the top part of her foot/toes.    HPI: She is here with right foot pain.  On New Year's Day she fell asleep in a chair, when she woke up her leg was apparently asleep and she rolled her foot underneath her.  She had immediate pain and swelling.  She started putting ice on her foot.  The pain has gotten better but it has not gone away, it hurts to plantarflex and dorsiflex her great toe and the pain sometimes travels up toward her ankle.              ROS:   All other systems were reviewed and are negative.  Objective: Vital Signs: There were no vitals taken for this visit.  Physical Exam:  General:  Alert and oriented, in no acute distress. Pulm:  Breathing unlabored. Psy:  Normal mood, congruent affect. Skin: No visible bruising. Right foot: She has tenderness to palpation around the first MTP joint.  There is limited dorsiflexion of about 45 degrees in both great toes at the MTP joint.  She has pain at the extreme on the right.  She also has pain when actively plantar flexing her toe, but her tendon functions are intact.  There is a dorsal spur at the first TMT joint.  Imaging: X-rays right foot: She has mild to moderate first MTP joint space narrowing.  I do not see a definite fracture.  Assessment & Plan: 1.  Right foot first MTP sprain with underlying osteoarthritis -Postop shoe for comfort for the next 2 or 3 weeks.  Voltaren gel as needed.  If symptoms persist, she will come back for recheck.     Procedures: No procedures  performed  No notes on file     PMFS History: Patient Active Problem List   Diagnosis Date Noted  . Temporal pain 11/16/2017  . Acute intractable headache 11/16/2017  . Poor venous access 11/14/2017  . 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 Dermatochalasis of both upper eyelids 06/29/2015  . 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 Sjogren's disease ([facility]) 11/11/2014  . Panic disorder 02/08/2013  . Chronic pain syndrome 11/09/2011  . Neuropathic pain of lower extremity 11/09/2011  . Back pain with radiation 07/05/2011  . Lichenification and lichen simplex chronicus 12/27/2010  . 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 Neuropathy 12/27/2010  . History of hysterectomy with oophorectomy 11/27/2010  . Pain in joints 11/27/2010  . Personality disorder (H[facility]) 10/22/2010  . Tobacco use disorder 10/22/2010  . Esophageal reflux 06/09/2009  . Major depressive disorder, single episode 12/13/2008  . Transient cerebral ischemia 11/18/2008   Past Medical History:  Diagnosis Date  . Back pain   . Cataract   . Depression   . Depression   . GERD (gastroesophageal reflux disease)   . Hepatitis C 12/07/2016  . Hyperlipidemia   . Radicular pain   . Sjogren's disease ([facility])   .  Sjogren's disease ([place])     Family History  Problem Relation Age of Onset  . Cancer Mother   . Asthma Brother   . Sickle cell anemia Brother   . Cancer Brother   . Lupus Brother     Past Surgical History:  Procedure Laterality Date  . ABDOMINAL HYSTERECTOMY     Social History   Occupational History    Comment: retired  Tobacco Use  . Smoking status: Former Smoker    Types: Cigarettes    Quit date: 1999    Years since quitting: 22.1  . Smokeless tobacco: Never Used  Substance and Sexual Activity  . Alcohol use: No    Comment: quit 1999  . Drug use: No  . Sexual activity: Never

## 2019-04-21 ENCOUNTER — Telehealth: Payer: Self-pay

## 2019-04-21 NOTE — Telephone Encounter (Signed)
Tanisa NP called and left VM on Triage phone. States patient Had OV with them yesterday and states her Ortho Dr was on Vacation and to go get an MRI from PCP.   They don't see why they need to order MRI.  Does she need to f/u with Dr. Prince Rome?   She would like a CB to discuss.   [phone]

## 2019-04-21 NOTE — Telephone Encounter (Signed)
I called and spoke with Irving Burton at [facility]. The patient was seen by Gala Murdoch, one of the nurse practitioners, yesterday afternoon - possibly for her foot pain. I let her know that I am trying to reach out to the patient to see how she is doing, so that Dr. Prince Rome can advise on how to proceed.  I have not gotten an answer on the patient's home phone, and the call won't go through on the mobile number.  Will try again later to reach the patient.

## 2019-04-22 NOTE — Telephone Encounter (Signed)
Tried calling the patient again - no answer on her home phone.

## 2019-04-23 NOTE — Telephone Encounter (Signed)
I left a message on the patient's voice mail to me back.

## 2019-04-29 NOTE — Telephone Encounter (Signed)
I have tried contacting the patient 3 times, the last time leaving a message for her to call me back. She has not done so. Signing off on this.

## 2020-02-25 ENCOUNTER — Ambulatory Visit: Attending: Adolescent Medicine | Primary: Adolescent Medicine

## 2020-02-25 ENCOUNTER — Ambulatory Visit
Admit: 2020-02-25 | Discharge: 2020-02-25 | Payer: MEDICARE | Attending: Adolescent Medicine | Primary: Adolescent Medicine

## 2020-02-25 DIAGNOSIS — Z0000 Encounter for general adult medical examination without abnormal findings: Secondary | ICD-10-CM

## 2020-02-25 NOTE — Progress Notes (Signed)
Chief Complaint   Patient presents with   . Establish Care   . Annual Wellness Visit     1. Have you been to the ER, urgent care clinic since your last visit?  Hospitalized since your last visit?No    2. Have you seen or consulted any other health care providers outside of the [facility] since your last visit?  Include any pap smears or colon screening. No  Visit Vitals  BP 120/75   Pulse 64   Temp 98.7 F (37.1 C) (Oral)   Resp 18   Ht 5\' 5"  (1.651 m)   Wt 130 lb 8 oz (59.2 kg)   SpO2 98%   BMI 21.72 kg/m       This is the Subsequent Medicare Annual Wellness Exam, performed 12 months or more after the Initial AWV or the last Subsequent AWV    I have reviewed the patient's medical history in detail and updated the computerized patient record.       Assessment/Plan   Education and counseling provided:  Are appropriate based on today's review and evaluation    1. Medicare annual wellness visit, subsequent       Depression Risk Factor Screening     3 most recent PHQ Screens 02/25/2020   Little interest or pleasure in doing things Several days   Feeling down, depressed, irritable, or hopeless Several days   Total Score PHQ 2 2   Trouble falling or staying asleep, or sleeping too much Not at all   Feeling tired or having little energy Not at all   Poor appetite, weight loss, or overeating Not at all   Feeling bad about yourself - or that you are a failure or have let yourself or your family down Not at all   Trouble concentrating on things such as school, work, reading, or watching TV Not at all   Moving or speaking so slowly that other people could have noticed; or the opposite being so fidgety that others notice Not at all   Thoughts of being better off dead, or hurting yourself in some way Not at all   PHQ 9 Score 2   How difficult have these problems made it for you to do your work, take care of your home and get along with others Not difficult at all       Alcohol & Drug Abuse Risk Screen    Do you  average more than 1 drink per night or more than 7 drinks a week:  No    On any one occasion in the past three months have you have had more than 3 drinks containing alcohol:  No          Functional Ability and Level of Safety    Hearing: Hearing is good.      Activities of Daily Living:  The home contains: no safety equipment.  Patient does total self care      Ambulation: with no difficulty     Fall Risk:  Fall Risk Assessment, last 12 mths 02/25/2020   Able to walk? Yes   Fall in past 12 months? 0   Do you feel unsteady? 0   Are you worried about falling 0      Abuse Screen:  Patient is not abused       Cognitive Screening    Has your family/caregiver stated any concerns about your memory: no     Cognitive Screening: Normal - Clock Drawing Test  Health Maintenance Due     Health Maintenance Due   Topic Date Due   . Hepatitis C Screening  Never done   . Depression Screen  Never done   . COVID-19 Vaccine (1) Never done   . DTaP/Tdap/Td series (1 - Tdap) Never done   . Lipid Screen  Never done   . Colorectal Cancer Screening Combo  Never done   . Shingrix Vaccine Age 75> (1 of 2) Never done   . Breast Cancer Screen Mammogram  Never done   . Bone Densitometry (Dexa) Screening  Never done   . Pneumococcal 65+ years (1 of 1 - PPSV23) Never done   . Flu Vaccine (1) Never done       Patient Care Team   Patient Care Team:  Mallie Snooks, MD as PCP - General (Family Medicine)    History   There is no problem list on file for this patient.    Past Medical History:   Diagnosis Date   . Arthritis       History reviewed. No pertinent surgical history.    Allergies   Allergen Reactions   . Penicillins Hives and Itching       History reviewed. No pertinent family history.  Social History     Tobacco Use   . Smoking status: Former Smoker     Quit date: 2011     Years since quitting: 11.1   . Smokeless tobacco: Never Used   Substance Use Topics   . Alcohol use: Never         Shannon Burch

## 2020-02-25 NOTE — Progress Notes (Signed)
SPORTS MEDICINE AND PRIMARY CARE  Estrella Myrtle. Lorita Forinash,MD  [address] [place] [address]    Chief Complaint   Patient presents with   ??? Establish Care   ??? Annual Wellness Visit       SUBJECTIVE:    Shannon Burch is a 75 y.o. female for care establishment and annual wellness evaluation.    Past medical history of neuropathy and Sjogren's.  Patient specifically requests    Hepatitis screening after possible exposure.      Neuropathy involves fingers, feet  Arthritis rt foot  sjogrens symptoms of dry skin, mouth ,skin    family hx  Sister- htn  Mother-throat cancer, dementia    Care gaps:  crc scr 11/2015 removed polyps:  Mammogram 2022  BDT  2019  Pneumovax -status unknown  shingrix -not up-to-date  covid vacc x3  tdap -utd      Current Outpatient Medications   Medication Sig Dispense Refill   ??? gabapentin (NEURONTIN) 800 mg tablet Take 800 mg by mouth three (3) times daily. 1Tablets in morning &  2 tablets at night as needed for pain in foot   Indications: acute pain following an operation     ??? fluticasone propionate (Flonase Allergy Relief) 50 mcg/actuation nasal spray 2 Sprays by Both Nostrils route daily.     ??? ibuprofen (MOTRIN) 800 mg tablet Take 800 mg by mouth as needed for Pain.       Past Medical History:   Diagnosis Date   ??? Arthritis      History reviewed. No pertinent surgical history.  Allergies   Allergen Reactions   ??? Penicillins Hives and Itching       REVIEW OF SYSTEMS:  General: negative for - chills or fever  ENT: negative for - headaches, nasal congestion, tinnitus, hearing loss, vision changes, sore throat  Respiratory: negative for - cough, hemoptysis, shortness of breath or wheezing  Cardiovascular : negative for - chest pain, edema, palpitations or shortness of breath  Gastrointestinal: negative for - abdominal pain, blood in stools, heartburn or nausea/vomiting, diarrhea, constipation  Genito-Urinary: no dysuria, trouble voiding, hematuria or erectile dysfunction  Musculoskeletal:  joint  pain, joint stiffness; negative for - gait disturbance, , joint swelling, muscle aches  Neurological: Neuropathy ;no TIA or stroke symptoms  Hematologic: no bruises, no bleeding, no swollen glands  Integument: no lumps, mole changes, nail changes or rash  Endocrine:no malaise/lethargy or unexpected weight changes      Social History     Socioeconomic History   ??? Marital status: SINGLE   Tobacco Use   ??? Smoking status: Former Smoker     Quit date: 2011     Years since quitting: 11.1   ??? Smokeless tobacco: Never Used   Vaping Use   ??? Vaping Use: Never used   Substance and Sexual Activity   ??? Alcohol use: Never   ??? Drug use: Never   ??? Sexual activity: Never     History reviewed. No pertinent family history.    OBJECTIVE:     Visit Vitals  BP 120/75   Pulse 64   Temp 98.7 ??F (37.1 ??C) (Oral)   Resp 18   Ht 5\' 5"  (1.651 m)   Wt 130 lb 8 oz (59.2 kg)   SpO2 98%   BMI 21.72 kg/m??     CONSTITUTIONAL: Pleasant cooperative in no acute distress, appears age appropriate  EYES: perrla, eom intact  ENMT:moist mucous membranes,   NECK: supple. Thyroid  normal  RESPIRATORY: Chest: clear bilaterally  CARDIOVASCULAR: Heart: regular rate and rhythm  GASTROINTESTINAL: Abdomen: soft, +bs, no HSM  HEMATOLOGIC: no pathological lymph nodes palpated  MUSCULOSKELETAL: Extremities: no edema, pulse 1+   INTEGUMENT: Warm and dry.  Nails appear normal.  NEUROLOGIC: non-focal exam   MENTAL STATUS: alert and oriented, appropriate affect       ASSESSMENT/PLAN:   1. Medicare annual wellness visit, subsequent    2. Polyp of colon, unspecified part of colon, unspecified type    3. Colon cancer screening    4. Dermatitis    5. Sjogren's syndrome, with unspecified organ involvement (HCC)    6. Elevated glucose    7. Lipid screening    8. Dry skin    9. Arthritis of foot    10. Exposure to viral hepatitis    11. Malaise      Colon polyp history.  Referral for lower endoscopy  Foot arthritis.  Patient requests podiatry referral.  Sjogren's syndrome.   Stable.  Followed by specialist.    .  Orders Placed This Encounter   ??? CBC WITH AUTOMATED DIFF   ??? METABOLIC PANEL, COMPREHENSIVE   ??? LIPID PANEL   ??? TSH 3RD GENERATION   ??? HEMOGLOBIN A1C WITH EAG   ??? HEPATITIS PANEL, ACUTE   ??? URINALYSIS W/ RFLX MICROSCOPIC   ??? REFERRAL TO GASTROENTEROLOGY   ??? REFERRAL TO DERMATOLOGY   ??? REFERRAL TO PODIATRY   ??? gabapentin (NEURONTIN) 800 mg tablet   ??? fluticasone propionate (Flonase Allergy Relief) 50 mcg/actuation nasal spray   ??? ibuprofen (MOTRIN) 800 mg tablet           I have discussed the diagnosis with the patient and the intended plan as seen in the  orders above.  The patient understands and agrees with the plan.  The patient has   received an after visit summary. Questions were answered concerning  future plans  Patient labs and/or xrays were reviewed as available.  Past records were reviewed as available.    Counseled regarding diet, exercise and healthy lifestyle          Advised patient tocall back or return to office if symptoms develop/worsen/change/persist.  Discussed expected course/resolution/complications of diagnosis in detail with patient.     Medication risks/benefits/costs/interactions/alternatives considered    Signed,  Irving Copas M.D.      This note was created using voice recognition software.  Edits have been made but syntax errors might exist.

## 2020-02-26 LAB — CBC WITH AUTO DIFFERENTIAL
Basophils %: 1 % (ref 0–1)
Basophils Absolute: 0 10*3/uL (ref 0.0–0.1)
Eosinophils %: 1 % (ref 0–7)
Eosinophils Absolute: 0.1 10*3/uL (ref 0.0–0.4)
Granulocyte Absolute Count: 0 10*3/uL (ref 0.00–0.04)
Hematocrit: 43 % (ref 35.0–47.0)
Hemoglobin: 14 g/dL (ref 11.5–16.0)
Immature Granulocytes: 0 % (ref 0.0–0.5)
Lymphocytes %: 27 % (ref 12–49)
Lymphocytes Absolute: 2.1 10*3/uL (ref 0.8–3.5)
MCH: 29.9 PG (ref 26.0–34.0)
MCHC: 32.6 g/dL (ref 30.0–36.5)
MCV: 91.7 FL (ref 80.0–99.0)
MPV: 11.7 FL (ref 8.9–12.9)
Monocytes %: 7 % (ref 5–13)
Monocytes Absolute: 0.5 10*3/uL (ref 0.0–1.0)
NRBC Absolute: 0 10*3/uL (ref 0.00–0.01)
Neutrophils %: 64 % (ref 32–75)
Neutrophils Absolute: 4.8 10*3/uL (ref 1.8–8.0)
Nucleated RBCs: 0 PER 100 WBC
Platelets: 185 10*3/uL (ref 150–400)
RBC: 4.69 M/uL (ref 3.80–5.20)
RDW: 12.7 % (ref 11.5–14.5)
WBC: 7.5 10*3/uL (ref 3.6–11.0)

## 2020-02-26 LAB — HEPATITIS PANEL, ACUTE
HCV Ab: >11 Index
Hep A IgM: NONREACTIVE
Hep B Core Ab, IgM: NONREACTIVE
Hep B surface Ag Interp.: NEGATIVE
Hep C virus Ab Interp.: REACTIVE — AB
Hepatitis A, IgM: NONREACTIVE
Hepatitis B Surface Ag: <0.1 Index
Hepatitis B core, IgM: NONREACTIVE
Hepatitis B surface Ag: <0.1 Index
Hepatitis C Ab: REACTIVE — AB
Hepatitis C virus Ab: >11 Index

## 2020-02-26 LAB — COMPREHENSIVE METABOLIC PANEL
ALT: 12 U/L (ref 12–78)
AST: 15 U/L (ref 15–37)
Albumin/Globulin Ratio: 1.1 (ref 1.1–2.2)
Albumin: 4.3 g/dL (ref 3.5–5.0)
Alkaline Phosphatase: 56 U/L (ref 45–117)
Anion Gap: 4 mmol/L — ABNORMAL LOW (ref 5–15)
BUN: 14 MG/DL (ref 6–20)
Bun/Cre Ratio: 17 (ref 12–20)
CO2: 29 mmol/L (ref 21–32)
Calcium: 10 MG/DL (ref 8.5–10.1)
Chloride: 105 mmol/L (ref 97–108)
Creatinine: 0.83 MG/DL (ref 0.55–1.02)
EGFR IF NonAfrican American: >60 mL/min/{1.73_m2} (ref >60)
GFR African American: >60 mL/min/{1.73_m2} (ref >60)
Globulin: 3.9 g/dL (ref 2.0–4.0)
Glucose: 87 mg/dL (ref 65–100)
Potassium: 5 mmol/L (ref 3.5–5.1)
Sodium: 138 mmol/L (ref 136–145)
Total Bilirubin: 0.4 MG/DL (ref 0.2–1.0)
Total Protein: 8.2 g/dL (ref 6.4–8.2)

## 2020-02-26 LAB — URINALYSIS W/ RFLX MICROSCOPIC
Bilirubin, Urine: NEGATIVE
Bilirubin: NEGATIVE
Blood, Urine: NEGATIVE
Blood: NEGATIVE
Glucose, Ur: NEGATIVE mg/dL
Glucose: NEGATIVE mg/dL
Ketone: NEGATIVE mg/dL
Ketones, Urine: NEGATIVE mg/dL
Leukocyte Esterase, Urine: NEGATIVE
Leukocyte Esterase: NEGATIVE
Nitrite, Urine: NEGATIVE
Nitrites: NEGATIVE
Protein, UA: NEGATIVE mg/dL
Protein: NEGATIVE mg/dL
Specific Gravity, UA: 1.008 (ref 1.003–1.030)
Specific gravity: 1.008 (ref 1.003–1.030)
Urobilinogen, UA, POCT: 0.2 EU/dL (ref 0.2–1.0)
Urobilinogen: 0.2 EU/dL (ref 0.2–1.0)
pH (UA): 6.5 (ref 5.0–8.0)
pH, UA: 6.5 (ref 5.0–8.0)

## 2020-02-26 LAB — LIPID PANEL
CHOL/HDL Ratio: 3.2 (ref 0.0–5.0)
Chol/HDL Ratio: 3.2 (ref 0.0–5.0)
Cholesterol, Total: 205 MG/DL — ABNORMAL HIGH (ref <200)
Cholesterol, total: 205 MG/DL — ABNORMAL HIGH (ref <200)
HDL Cholesterol: 64 MG/DL
HDL: 64 MG/DL
LDL Calculated: 122.4 MG/DL — ABNORMAL HIGH (ref 0–100)
LDL, calculated: 122.4 MG/DL — ABNORMAL HIGH (ref 0–100)
Triglyceride: 93 MG/DL (ref <150)
Triglycerides: 93 MG/DL (ref <150)
VLDL Cholesterol Calculated: 18.6 MG/DL
VLDL, calculated: 18.6 MG/DL

## 2020-02-26 LAB — HEMOGLOBIN A1C W/EAG
Hemoglobin A1C: 5.5 % (ref 4.0–5.6)
eAG: 111 mg/dL

## 2020-02-26 LAB — TSH 3RD GENERATION
TSH: 0.74 u[IU]/mL (ref 0.36–3.74)
TSH: 0.74 u[IU]/mL (ref 0.36–3.74)

## 2020-02-26 LAB — METABOLIC PANEL, COMPREHENSIVE
A-G Ratio: 1.1 (ref 1.1–2.2)
ALT (SGPT): 12 U/L (ref 12–78)
AST (SGOT): 15 U/L (ref 15–37)
Albumin: 4.3 g/dL (ref 3.5–5.0)
Alk. phosphatase: 56 U/L (ref 45–117)
Anion gap: 4 mmol/L — ABNORMAL LOW (ref 5–15)
BUN/Creatinine ratio: 17 (ref 12–20)
BUN: 14 MG/DL (ref 6–20)
Bilirubin, total: 0.4 MG/DL (ref 0.2–1.0)
CO2: 29 mmol/L (ref 21–32)
Calcium: 10 MG/DL (ref 8.5–10.1)
Chloride: 105 mmol/L (ref 97–108)
Creatinine: 0.83 MG/DL (ref 0.55–1.02)
GFR est AA: >60 mL/min/{1.73_m2} (ref >60)
GFR est non-AA: >60 mL/min/{1.73_m2} (ref >60)
Globulin: 3.9 g/dL (ref 2.0–4.0)
Glucose: 87 mg/dL (ref 65–100)
Potassium: 5 mmol/L (ref 3.5–5.1)
Protein, total: 8.2 g/dL (ref 6.4–8.2)
Sodium: 138 mmol/L (ref 136–145)

## 2020-02-26 LAB — CBC WITH AUTOMATED DIFF
ABS. BASOPHILS: 0 10*3/uL (ref 0.0–0.1)
ABS. EOSINOPHILS: 0.1 10*3/uL (ref 0.0–0.4)
ABS. IMM. GRANS.: 0 10*3/uL (ref 0.00–0.04)
ABS. LYMPHOCYTES: 2.1 10*3/uL (ref 0.8–3.5)
ABS. MONOCYTES: 0.5 10*3/uL (ref 0.0–1.0)
ABS. NEUTROPHILS: 4.8 10*3/uL (ref 1.8–8.0)
ABSOLUTE NRBC: 0 10*3/uL (ref 0.00–0.01)
BASOPHILS: 1 % (ref 0–1)
EOSINOPHILS: 1 % (ref 0–7)
HCT: 43 % (ref 35.0–47.0)
HGB: 14 g/dL (ref 11.5–16.0)
IMMATURE GRANULOCYTES: 0 % (ref 0.0–0.5)
LYMPHOCYTES: 27 % (ref 12–49)
MCH: 29.9 PG (ref 26.0–34.0)
MCHC: 32.6 g/dL (ref 30.0–36.5)
MCV: 91.7 FL (ref 80.0–99.0)
MONOCYTES: 7 % (ref 5–13)
MPV: 11.7 FL (ref 8.9–12.9)
NEUTROPHILS: 64 % (ref 32–75)
NRBC: 0 PER 100 WBC
PLATELET: 185 10*3/uL (ref 150–400)
RBC: 4.69 M/uL (ref 3.80–5.20)
RDW: 12.7 % (ref 11.5–14.5)
WBC: 7.5 10*3/uL (ref 3.6–11.0)

## 2020-02-26 LAB — HEMOGLOBIN A1C WITH EAG
Est. average glucose: 111 mg/dL
Hemoglobin A1c: 5.5 % (ref 4.0–5.6)

## 2020-02-29 ENCOUNTER — Encounter

## 2020-03-01 ENCOUNTER — Telehealth: Payer: Self-pay | Admitting: Family Medicine

## 2020-03-01 MED ORDER — GABAPENTIN 800 MG TAB
800 mg | ORAL_TABLET | Freq: Three times a day (TID) | ORAL | 0 refills | Status: DC
Start: 2020-03-01 — End: 2020-04-05

## 2020-03-01 MED ORDER — FLUTICASONE 50 MCG/ACTUATION NASAL SPRAY, SUSP
50 mcg/actuation | Freq: Every day | NASAL | 2 refills | Status: DC
Start: 2020-03-01 — End: 2020-05-12

## 2020-03-01 NOTE — Telephone Encounter (Signed)
Received call from patient. She needs copy of last ov note 02/15/2019. I took verbal auth. As she relocated to [place]. Mailed 02/15/19 note to her [address] [place], [place]  [address]. Ph [phone]

## 2020-03-01 NOTE — Telephone Encounter (Signed)
Patient called. Would like [name] to call her. Has some questions.

## 2020-03-02 NOTE — Telephone Encounter (Signed)
I called the patient. She wanted to know if the results of the foot xray taken at the last ov are in the note that she is being sent by medical records. The patient has relocated to [place] and will need to follow up with an ortho there. I read those results to her, assuring her that she would not have to try and remember all of that - it will be plain for the new doctor to see.

## 2020-03-10 NOTE — Telephone Encounter (Signed)
Patient is waiting on her handicap sticker can one be completed and call her she states she has called 3 times since her visit

## 2020-04-05 ENCOUNTER — Encounter

## 2020-04-05 MED ORDER — GABAPENTIN 800 MG TAB
800 mg | ORAL_TABLET | ORAL | 0 refills | Status: DC
Start: 2020-04-05 — End: 2020-05-12

## 2020-04-07 ENCOUNTER — Ambulatory Visit: Attending: Adolescent Medicine | Primary: Adolescent Medicine

## 2020-04-07 ENCOUNTER — Ambulatory Visit
Admit: 2020-04-07 | Discharge: 2020-04-07 | Payer: MEDICARE | Attending: Adolescent Medicine | Primary: Adolescent Medicine

## 2020-04-07 DIAGNOSIS — R768 Other specified abnormal immunological findings in serum: Secondary | ICD-10-CM

## 2020-04-07 MED ORDER — SERTRALINE 25 MG TAB
25 mg | ORAL_TABLET | Freq: Every day | ORAL | 1 refills | Status: DC
Start: 2020-04-07 — End: 2020-06-07

## 2020-04-07 MED ORDER — BUTALBITAL-ACETAMINOPHEN-CAFFEINE 50 MG-325 MG-40 MG TAB
50-325-40 mg | ORAL_TABLET | ORAL | 0 refills | Status: AC | PRN
Start: 2020-04-07 — End: 2020-12-05

## 2020-04-07 NOTE — Progress Notes (Signed)
SPORTS MEDICINE AND PRIMARY CARE  Dorthula Nettles. Bralynn Donado,MD  [place] [address]    Chief Complaint   Patient presents with   ??? Labs     results   ??? Depression       SUBJECTIVE:    Seleni Meller is a 75 y.o. female for lab review and depression evaluation.    History of chronic depression.  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 sertraline (ZOLOFT) 25 mg tablet Take 1 Tablet by mouth daily. 30 Tablet 1   ??? butalbital-acetaminophen-caffeine (FIORICET, ESGIC) 50-325-40 mg per tablet Take 1 Tablet by mouth every four (4) hours as needed for Headache. 10 Tablet 0   ??? gabapentin (NEURONTIN) 800 mg tablet Take 1 tab po in morning &  2 tablets at night for pain in foot ADDITIONAL REFILLS BY APPOINTMENT ONLY  Indications: acute pain following an operation 90 Tablet 0   ??? fluticasone propionate (Flonase Allergy Relief) 50 mcg/actuation nasal spray 2 Sprays by Both Nostrils route daily. 1 Each 2   ??? ibuprofen (MOTRIN) 800 mg tablet Take 800 mg by mouth as needed for Pain.       Past Medical History:   Diagnosis Date   ??? Arthritis      History reviewed. No pertinent surgical history.  Allergies   Allergen Reactions   ??? Tbo-Filgrastim Hives   ??? Penicillins Hives and Itching       REVIEW OF SYSTEMS:  General: negative for - chills or fever  ENT: headaches; negative for -  nasal congestion, tinnitus, hearing loss, vision changes, sore throat  Respiratory: negative for - cough, hemoptysis, shortness of breath or wheezing  Cardiovascular :  negative for - chest pain, edema, palpitations or shortness of breath  Gastrointestinal: negative for - abdominal pain, blood in stools, heartburn or nausea/vomiting, diarrhea, constipation  Genito-Urinary: no dysuria, trouble voiding, hematuria   Musculoskeletal: negative for - gait disturbance, joint pain, joint stiffness , joint swelling, muscle aches  Neurological: no TIA or stroke symptoms  Hematologic: no bruises, no bleeding, no swollen glands  Integument: no lumps, mole changes, nail changes or rash  Endocrine:no malaise/lethargy or unexpected weight changes      Social History     Socioeconomic History   ??? Marital status: SINGLE   Tobacco Use   ??? Smoking status: Former Smoker     Quit date: 2011     Years since quitting: 11.2   ??? Smokeless tobacco: Never Used   Vaping Use   ??? Vaping Use: Never used   Substance and Sexual Activity   ??? Alcohol use: Never   ??? Drug use: Never   ??? Sexual activity: Never     History reviewed. No pertinent family history.    OBJECTIVE:     Visit Vitals  BP 113/68 (BP 1 Location: Left upper arm, BP Patient Position: Sitting, BP Cuff Size: Adult)   Pulse  70   Temp 97.8 ??F (36.6 ??C) (Oral)   Resp 15   Ht 5' 5" (1.651 m)   Wt 132 lb 12.8 oz (60.2 kg)   SpO2 96%   BMI 22.10 kg/m??     CONSTITUTIONAL:  appears in usual state of health  EYES:  eom intact  ENMT:moist mucous membranes,  NECK: supple.   COR: s1s2 rrr  LUNGS: clear  INTEGUMENT: warm and dry  MENTAL STATUS: alert and oriented, appropriate affect     ASSESSMENT:   1. Hepatitis C antibody test positive    2. Moderate episode of recurrent major depressive disorder ([place])    3. Chronic nonintractable headache, unspecified headache type      PLAN:  .  Orders Placed This Encounter   ??? HEPATITIS C QT BY PCR WITH REFLEX GENOTYPE   ??? SED RATE (ESR)   ??? sertraline (ZOLOFT) 25 mg tablet   ??? butalbital-acetaminophen-caffeine (FIORICET, ESGIC) 50-325-40 mg per tablet       Follow-up and Dispositions    ?? Return in about 4 weeks (around  05/05/2020). Medication follow up         I have discussed the diagnosis with the patient and the intended plan as seen in the  orders above.  The patient understands and agrees with the plan.  The patient has   received an after visit summary. Questions were answered concerning  future plans  Patient labs and/or xrays were reviewed as available.  Past records were reviewed as available.    Counseled regarding diet, exercise and healthy lifestyle          Advised patient to call back or return to office if symptoms develop/worsen/change/persist.  Discussed expected course/resolution/complications of diagnosis in detail with patient.     Medication risks/benefits/costs/interactions/alternatives discussed with patient    Signed,  Nikki Dom M.D.      This note was created using voice recognition software.  Edits have been made but syntax errors might exist.

## 2020-04-07 NOTE — Progress Notes (Signed)
 Rm    Chief Complaint   Patient presents with   . Labs     results   . Depression        Visit Vitals  BP 113/68 (BP 1 Location: Left upper arm, BP Patient Position: Sitting, BP Cuff Size: Adult)   Pulse 70   Temp 97.8 F (36.6 C) (Oral)   Resp 15   Ht 5' 5 (1.651 m)   Wt 132 lb 12.8 oz (60.2 kg)   SpO2 96%   BMI 22.10 kg/m        1. Have you been to the ER, urgent care clinic since your last visit?  Hospitalized since your last visit?no    2. Have you seen or consulted any other health care providers outside of the [facility] since your last visit?  Include any pap smears or colon screening. no    Health Maintenance Due   Topic Date Due   . COVID-19 Vaccine (1) Never done   . DTaP/Tdap/Td series (1 - Tdap) Never done   . Colorectal Cancer Screening Combo  Never done   . Shingrix Vaccine Age 23> (1 of 2) Never done   . Breast Cancer Screen Mammogram  Never done   . Bone Densitometry (Dexa) Screening  Never done   . Pneumococcal 65+ years (1 of 1 - PPSV23) Never done        3 most recent PHQ Screens 04/07/2020   Little interest or pleasure in doing things Several days   Feeling down, depressed, irritable, or hopeless Several days   Total Score PHQ 2 2   Trouble falling or staying asleep, or sleeping too much -   Feeling tired or having little energy -   Poor appetite, weight loss, or overeating -   Feeling bad about yourself - or that you are a failure or have let yourself or your family down -   Trouble concentrating on things such as school, work, reading, or watching TV -   Moving or speaking so slowly that other people could have noticed; or the opposite being so fidgety that others notice -   Thoughts of being better off dead, or hurting yourself in some way -   PHQ 9 Score -   How difficult have these problems made it for you to do your work, take care of your home and get along with others -        Fall Risk Assessment, last 12 mths 04/07/2020   Able to walk? Yes   Fall in past 12 months? 0   Do  you feel unsteady? 0   Are you worried about falling -       No flowsheet data found.

## 2020-04-08 LAB — SEDIMENTATION RATE: Sed Rate: 8 mm/hr (ref 0–30)

## 2020-04-08 LAB — SED RATE (ESR): Sed rate, automated: 8 mm/hr (ref 0–30)

## 2020-04-11 LAB — HEPATITIS C QT BY PCR WITH REFLEX GENOTYPE
HEPATITIS C QUANTITATION, 550036: NOT DETECTED IU/mL
Hepatitis C Quantitation: NOT DETECTED IU/mL

## 2020-04-11 NOTE — Telephone Encounter (Signed)
Attempted to assist patient but she states she wants to speak directly to you.

## 2020-04-11 NOTE — Telephone Encounter (Signed)
-----   Message from Judene Companion sent at 04/11/2020  3:20 PM EDT -----  Subject: Message to Provider    QUESTIONS  Information for Provider? pt needs a call back regarding the gabapentin   800 mg. she spoke to the pharmacy and now is requesting to either speak to   cheryl or her assistant. please call her back.Marland Kitchen   ---------------------------------------------------------------------------  --------------  Shannon Burch INFO  What is the best way for the office to contact you? OK to leave message on   voicemail  Preferred Call Back Phone Number? [phone]  ---------------------------------------------------------------------------  --------------  SCRIPT ANSWERS  Relationship to Patient? Self

## 2020-05-04 ENCOUNTER — Encounter: Attending: Adolescent Medicine | Primary: Adolescent Medicine

## 2020-05-12 ENCOUNTER — Telehealth: Attending: Adolescent Medicine | Primary: Adolescent Medicine

## 2020-05-12 ENCOUNTER — Telehealth: Admit: 2020-05-12 | Payer: MEDICARE | Attending: Adolescent Medicine | Primary: Adolescent Medicine

## 2020-05-12 ENCOUNTER — Encounter: Attending: Adolescent Medicine | Primary: Adolescent Medicine

## 2020-05-12 DIAGNOSIS — G629 Polyneuropathy, unspecified: Secondary | ICD-10-CM

## 2020-05-12 MED ORDER — FLUTICASONE 50 MCG/ACTUATION NASAL SPRAY, SUSP
50 mcg/actuation | Freq: Every day | NASAL | 2 refills | Status: AC
Start: 2020-05-12 — End: ?

## 2020-05-12 MED ORDER — GABAPENTIN 800 MG TAB
800 mg | ORAL_TABLET | ORAL | 0 refills | Status: DC
Start: 2020-05-12 — End: 2020-06-06

## 2020-05-12 NOTE — Progress Notes (Signed)
Chief Complaint   Patient presents with   . Medication Evaluation     Patient is here for a medication refill.      1. Have you been to the ER, urgent care clinic since your last visit?  Hospitalized since your last visit?No    2. Have you seen or consulted any other health care providers outside of the [facility] since your last visit?  Include any pap smears or colon screening. No

## 2020-05-12 NOTE — Progress Notes (Signed)
Shannon Burch (DOB: Dec 15, 1945) is a 75 y.o. female, established patient, here for evaluation of the following chief complaint(s):   Medication Evaluation (Patient is here for a medication refill )       ASSESSMENT/PLAN:  Below is the assessment and plan developed based on review of pertinent history, labs, studies, and medications.    1. Post-menopausal  -     DEXA BONE DENSITY STUDY AXIAL; Future  2. Encounter for screening mammogram for malignant neoplasm of breast  -     MAM MAMMO BI SCREENING INCL CAD; Future  3. Colon cancer screening  -     REFERRAL TO GASTROENTEROLOGY  4. History of colon polyps  -     REFERRAL TO GASTROENTEROLOGY  5. Neuropathy  -     gabapentin (Neurontin) 800 mg tablet; Take 1 tab po in morning &  2 tablets at night for foot neuropathy DISPENSE BRAND NAME ONLY/NEURONTIN, Normal, Disp-90 Tablet, R-0  6. Seasonal allergic rhinitis due to pollen  -     fluticasone propionate (Flonase Allergy Relief) 50 mcg/actuation nasal spray; 2 Sprays by Both Nostrils route daily., Normal, Disp-1 Each, R-2    I have discussed the intended plan as seen in the  orders above.  The patient understands and agrees with the plan.  All questions the patient posed were answered.  Patient labs and/or xrays were reviewed  Past records were reviewed.Advised patient to call back or return to office if symptoms worsen/change/persist.  Discussed expected course/resolution/complications of diagnosis in detail with patient.     Medication risks/benefits/costs/interactions/alternatives considered    Patient agrees to set next appointment.    SUBJECTIVE/OBJECTIVE:  HPI  Allergic Rhinitis history.  Patient requests Flonase refill.    Patient request brand-name gabapentin/neurontin refill.  She prefers this generic gabapentin.    History of colon polyps.  Needs follow-up colonoscopy.    Health maintenance: Mammogram and bone density test      Review of Systems   Ros per hpi    No data recorded     Physical Exam    [INSTRUCTIONS:   "[x] " Indicates a positive item  "[] " Indicates a negative item  -- DELETE ALL ITEMS NOT EXAMINED]    Constitutional: [x]  Appears well-developed and well-nourished [x]  No apparent distress      []  Abnormal -     Mental status: [x]  Alert and awake  [x]  Oriented to person/place/time [x]  Able to follow commands    []  Abnormal -     Eyes:   EOM    [x]   Normal    []  Abnormal -   Sclera  [x]   Normal    []  Abnormal -          Discharge [x]   None visible   []  Abnormal -     HENT: [x]  Normocephalic, atraumatic  []  Abnormal -   [x]  Mouth/Throat: Mucous membranes are moist    External Ears [x]  Normal  []  Abnormal -    Neck: [x]  No visualized mass []  Abnormal -     Pulmonary/Chest: [x]  Respiratory effort normal   [x]  No visualized signs of difficulty breathing or respiratory distress        []  Abnormal -      Musculoskeletal:   []  Normal gait with no signs of ataxia         [x]  Normal range of motion of neck        []  Abnormal -     Neurological:        [  x] No Facial Asymmetry (Cranial nerve 7 motor function) (limited exam due to video visit)          [x]  No gaze palsy        []  Abnormal -          Skin:        [x]  No significant exanthematous lesions or discoloration noted on facial skin         []  Abnormal -            Psychiatric:       [x]  Normal Affect []  Abnormal -        [x]  No Hallucinations    Other pertinent observable physical exam findings:-            , was evaluated through a synchronous (real-time) audio-video encounter. The patient (or guardian if applicable) is aware that this is a billable service, which includes applicable co-pays. Verbal consent to proceed has been obtained. The visit was conducted pursuant to the emergency declaration under the and the , 1135 waiver authority and the and Act.  Patient identification was verified, and a caregiver was present when appropriate. The patient was  located at home in a state where the provider was licensed to provide care.       An electronic signature was used to authenticate this note.  -- , MD

## 2020-05-12 NOTE — Addendum Note (Signed)
Addendum  Note by Mallie Snooks, MD at 05/12/20 0900                Author: Mallie Snooks, MD  Service: --  Author Type: Physician       Filed: 05/18/20 1532  Encounter Date: 05/12/2020  Status: Signed          Editor: Mallie Snooks, MD (Physician)          Addended by: Mallie Snooks on: 05/18/2020 03:32 PM    Modules accepted: Level of Service

## 2020-05-30 ENCOUNTER — Inpatient Hospital Stay: Payer: MEDICARE

## 2020-05-30 MED ORDER — ATROPINE 0.1 MG/ML SYRINGE
0.1 mg/mL | Freq: Once | INTRAMUSCULAR | Status: DC | PRN
Start: 2020-05-30 — End: 2020-05-30

## 2020-05-30 MED ORDER — PROPOFOL 10 MG/ML IV EMUL
10 mg/mL | INTRAVENOUS | Status: DC | PRN
Start: 2020-05-30 — End: 2020-05-30
  Administered 2020-05-30 (×13): via INTRAVENOUS

## 2020-05-30 MED ORDER — SODIUM CHLORIDE 0.9 % IJ SYRG
Freq: Three times a day (TID) | INTRAMUSCULAR | Status: DC
Start: 2020-05-30 — End: 2020-05-30

## 2020-05-30 MED ORDER — SODIUM CHLORIDE 0.9 % IV
INTRAVENOUS | Status: DC
Start: 2020-05-30 — End: 2020-05-30
  Administered 2020-05-30: 17:00:00 via INTRAVENOUS

## 2020-05-30 MED ORDER — SIMETHICONE 40 MG/0.6 ML ORAL DROPS, SUSP
40 mg/0.6 mL | ORAL | Status: DC | PRN
Start: 2020-05-30 — End: 2020-05-30

## 2020-05-30 MED ORDER — EPINEPHRINE 0.1 MG/ML SYRINGE
0.1 mg/mL | Freq: Once | INTRAMUSCULAR | Status: DC | PRN
Start: 2020-05-30 — End: 2020-05-30

## 2020-05-30 MED ORDER — LIDOCAINE (PF) 20 MG/ML (2 %) IJ SOLN
20 mg/mL (2 %) | INTRAMUSCULAR | Status: DC | PRN
Start: 2020-05-30 — End: 2020-05-30
  Administered 2020-05-30: 17:00:00 via INTRAVENOUS

## 2020-05-30 MED ORDER — FLUMAZENIL 0.1 MG/ML IV SOLN
0.1 mg/mL | INTRAVENOUS | Status: DC | PRN
Start: 2020-05-30 — End: 2020-05-30

## 2020-05-30 MED ORDER — NALOXONE 0.4 MG/ML INJECTION
0.4 mg/mL | INTRAMUSCULAR | Status: DC | PRN
Start: 2020-05-30 — End: 2020-05-30

## 2020-05-30 MED ORDER — SODIUM CHLORIDE 0.9 % IJ SYRG
INTRAMUSCULAR | Status: DC | PRN
Start: 2020-05-30 — End: 2020-05-30

## 2020-05-30 MED ORDER — SODIUM CHLORIDE 0.9 % IV
INTRAVENOUS | Status: DC | PRN
Start: 2020-05-30 — End: 2020-05-30
  Administered 2020-05-30: 17:00:00 via INTRAVENOUS

## 2020-05-30 MED FILL — NORMAL SALINE FLUSH 0.9 % INJECTION SYRINGE: INTRAMUSCULAR | Qty: 40

## 2020-05-30 MED FILL — SODIUM CHLORIDE 0.9 % IV: INTRAVENOUS | Qty: 1000

## 2020-05-30 NOTE — Anesthesia Post-Procedure Evaluation (Signed)
Post-Anesthesia Evaluation and Assessment    Patient: Shannon Burch MRN: [id number]  SSN: [id number]    Date of Birth: 22-Mar-1945  Age: 75 y.o.  Sex: female      I have evaluated the patient and they are stable and ready for discharge from the PACU.     Cardiovascular Function/Vital Signs  Visit Vitals  BP (!) 98/57   Pulse (!) 56   Temp 36.4 ??C (97.6 ??F)   Resp 14   Ht _0  (1.651 m)   Wt 59.1 kg (130 lb 3.2 oz)   SpO2 97%   Breastfeeding No   BMI 21.67 kg/m??       Patient is status post MAC anesthesia for Procedure(s):  COLONOSCOPY  ENDOSCOPIC POLYPECTOMY.    Nausea/Vomiting: None    Postoperative hydration reviewed and adequate.    Pain:  Pain Scale 1: Numeric (0 - 10) (05/30/20 1125)  Pain Intensity 1: 0 (05/30/20 1125)   Managed    Neurological Status:       At baseline    Mental Status, Level of Consciousness: Alert and  oriented to person, place, and time    Pulmonary Status:   O2 Device: None (Room air) (05/30/20 1345)   Adequate oxygenation and airway patent    Complications related to anesthesia: None    Post-anesthesia assessment completed. No concerns    Signed By: Johnnette Litter, MD     May 30, 2020              Procedure(s):  COLONOSCOPY  ENDOSCOPIC POLYPECTOMY.    MAC    <BSHSIANPOST>    INITIAL Post-op Vital signs:   Vitals Value Taken Time   BP 98/57 05/30/20 1355   Temp     Pulse 55 05/30/20 1404   Resp 15 05/30/20 1404   SpO2 99 % 05/30/20 1354   Vitals shown include unvalidated device data.

## 2020-05-30 NOTE — Anesthesia Pre-Procedure Evaluation (Signed)
Relevant Problems   No relevant active problems       Anesthetic History   No history of anesthetic complications            Review of Systems / Medical History  Patient summary reviewed, nursing notes reviewed and pertinent labs reviewed    Pulmonary  Within defined limits                 Neuro/Psych   Within defined limits           Cardiovascular                  Exercise tolerance: >4 METS     GI/Hepatic/Renal                Endo/Other        Arthritis     Other Findings              Physical Exam    Airway  Mallampati: I  TM Distance: > 6 cm  Neck ROM: normal range of motion   Mouth opening: Normal     Cardiovascular    Rhythm: regular           Dental  No notable dental hx       Pulmonary  Breath sounds clear to auscultation               Abdominal         Other Findings            Anesthetic Plan    ASA: 2  Anesthesia type: MAC          Induction: Intravenous  Anesthetic plan and risks discussed with: Patient

## 2020-05-30 NOTE — Procedures (Signed)
Procedures  by Inez Pilgrim, MD at 05/30/20 1344                Author: Inez Pilgrim, MD  Service: Gastroenterology  Author Type: Physician       Filed: 05/30/20 1346  Date of Service: 05/30/20 1344  Status: Signed          Editor: Inez Pilgrim, MD (Physician)            Pre-procedure Diagnoses        1. History of colon polyps [Z86.010]                           Post-procedure Diagnoses        1. Polyp of rectum [K62.1]        2. Polyp of descending colon, unspecified type [K63.5]                           Procedures        1. Herbert Seta [[id number]]                              Terry   Ezel, [place]                       Colonoscopy Procedure Note      Indications:   See Preoperative Diagnosis above   Referring Physician: Lesli Albee, MD   Anesthesia/Sedation: MAC anesthesia Propofol   Endoscopist:  Dr. Inez Pilgrim   Assistant:  Endoscopy Technician-1: Rodney Booze   Endoscopy RN-1: Kelby Fam, RN   Preoperative diagnosis: PERSONAL HX OF COLONIC POLYPS   Postoperative diagnosis: colon polyps      Procedure in Detail:   Informed consent was obtained for the procedure, including sedation.  Risks of perforation, hemorrhage, adverse drug reaction, and aspiration were discussed. The patient was placed in the left lateral  decubitus position.  Based on the pre-procedure assessment, including review of the patient's medical history, medications, allergies, and review of systems, she  had been deemed to be an appropriate candidate for  sedation; she was therefore sedated with the medications listed above.   The patient was  monitored continuously with ECG tracing, pulse oximetry, blood pressure monitoring, and direct observations.        A rectal examination was performed. The PCFH190L was inserted into the rectum and advanced under direct vision to the cecum, which was identified by the ileocecal valve and appendiceal orifice.   The quality of the colonic preparation was fair.  A careful  inspection was made as the colonoscope was withdrawn, including a retroflexed view of the rectum; findings and interventions are described below.  Appropriate photodocumentation was obtained.      Findings:   Rectum: Two polyps measuring 4 mm each were removed with cold snare   Sigmoid: normal   Descending Colon: 4 mm polyp removed with cold snare   Transverse Colon: normal   Ascending Colon: normal   Cecum: normal      Specimens:     colon polyps      EBL: None      Complications: None; patient tolerated the procedure well.      Recommendations:      - Await pathology.     - Repeat colonoscopy in  5 years.         Signed By: Inez Pilgrim, MD                         May 30, 2020

## 2020-05-30 NOTE — Progress Notes (Signed)
Shannon Burch  April 04, 1945  [id number]    Situation:  Verbal report received from: [name] RN  Procedure: Procedure(s):  COLONOSCOPY  ENDOSCOPIC POLYPECTOMY    Background:    Preoperative diagnosis: PERSONAL HX OF COLONIC POLYPS  Postoperative diagnosis: colon polyps    Operator:  Dr. Lucianne Muss  Assistant(s): Endoscopy Technician-1: [name]  Endoscopy RN-1: [name], RN    Specimens:   ID Type Source Tests Collected by Time Destination   1 : pathology Preservative Rectum  [name], MD 05/30/2020 1316 Pathology   2 : pathology Preservative Colon, Descending  [name], MD 05/30/2020 1323 Pathology     H. Pylori  no    Assessment:  Intra-procedure medications   Anesthesia gave intra-procedure sedation and medications, see anesthesia flow sheet yes    Intravenous fluids: NS@ KVO     Vital signs stable     Abdominal assessment: round and soft     Recommendation:  Discharge patient per MD order.  Family or Friend - friend  Permission to share finding with family or friend yes

## 2020-05-30 NOTE — H&P (Signed)
Colonoscopy History and Physical      The patient was seen and examined.Date of last colonoscopy: 2017, Polyps  Yes      The airway was assessed and documented.  The problem list, past medical history, and medications were reviewed.     There is no problem list on file for this patient.    Social History     Socioeconomic History   ??? Marital status: SINGLE     Spouse name: Not on file   ??? Number of children: Not on file   ??? Years of education: Not on file   ??? Highest education level: Not on file   Occupational History   ??? Not on file   Tobacco Use   ??? Smoking status: Former Smoker     Quit date: 2011     Years since quitting: 11.4   ??? Smokeless tobacco: Never Used   Vaping Use   ??? Vaping Use: Never used   Substance and Sexual Activity   ??? Alcohol use: Never   ??? Drug use: Never   ??? Sexual activity: Never   Other Topics Concern   ??? Not on file   Social History Narrative   ??? Not on file     Social Determinants of Health     Financial Resource Strain:    ??? Difficulty of Paying Living Expenses: Not on file   Food Insecurity:    ??? Worried About Running Out of Food in the Last Year: Not on file   ??? Ran Out of Food in the Last Year: Not on file   Transportation Needs:    ??? Lack of Transportation (Medical): Not on file   ??? Lack of Transportation (Non-Medical): Not on file   Physical Activity:    ??? Days of Exercise per Week: Not on file   ??? Minutes of Exercise per Session: Not on file   Stress:    ??? Feeling of Stress : Not on file   Social Connections:    ??? Frequency of Communication with Friends and Family: Not on file   ??? Frequency of Social Gatherings with Friends and Family: Not on file   ??? Attends Religious Services: Not on file   ??? Active Member of Clubs or Organizations: Not on file   ??? Attends Banker Meetings: Not on file   ??? Marital Status: Not on file   Intimate Partner Violence:    ??? Fear of Current or Ex-Partner: Not on file   ??? Emotionally Abused: Not on file   ??? Physically Abused: Not on file   ???  Sexually Abused: Not on file   Housing Stability:    ??? Unable to Pay for Housing in the Last Year: Not on file   ??? Number of Places Lived in the Last Year: Not on file   ??? Unstable Housing in the Last Year: Not on file     Past Medical History:   Diagnosis Date   ??? Arthritis          Prior to Admission Medications   Prescriptions Last Dose Informant Patient Reported? Taking?   butalbital-acetaminophen-caffeine (FIORICET, ESGIC) 50-325-40 mg per tablet   No No   Sig: Take 1 Tablet by mouth every four (4) hours as needed for Headache.   fluticasone propionate (Flonase Allergy Relief) 50 mcg/actuation nasal spray   No No   Sig: 2 Sprays by Both Nostrils route daily.   gabapentin (Neurontin) 800 mg tablet   No  No   Sig: Take 1 tab po in morning &  2 tablets at night for foot neuropathy DISPENSE BRAND NAME ONLY/NEURONTIN   ibuprofen (MOTRIN) 800 mg tablet   Yes No   Sig: Take 800 mg by mouth as needed for Pain.   sertraline (ZOLOFT) 25 mg tablet   No No   Sig: Take 1 Tablet by mouth daily.      Facility-Administered Medications: None       The patient was seen and examined in the endoscopy suite. The airway was assessed and documented. The problem list and medications were reviewed.     Chief complaint, history of present illness, and review of systems and Past medical History are positive for: history of polyps    The heart, lungs and mental status were satisfactory for the administration of sedation and for the procedure.     I discussed with the patient the objectives, risks, consequences and alternatives to the procedure.     The patient was counseled at length about the risks of contracting Covid-19 in the peri-operative and post-operative states including the recovery window of their procedure.  The patient was made aware that contracting Covid-19 after a surgical procedure may worsen their prognosis for recovering from the virus and lend to a higher morbidity and or mortality risk.  The patient was given the  options of postponing their procedure. All of the risks, benefits, and alternatives were discussed. The patient does  wish to proceed with the procedure.    Plan: Endoscopic procedure with sedation     [name], MD   05/30/2020  1:11 PM

## 2020-05-30 NOTE — Progress Notes (Signed)
Received a voice message from patient stating that she hasnt felt right since her colonoscopy over a month ago. She stated that she followed up with the gastroenterologist with a post procedure phone call, visited an urgent care, and she notified her PCP but intentionally did not show to her scheduled appointment (today), Her symptoms are vague and genealized. Encouraged to call provider (gastroenterologist, pcp, or urgent care).

## 2020-06-06 ENCOUNTER — Encounter

## 2020-06-06 MED ORDER — GABAPENTIN 800 MG TAB
800 mg | ORAL_TABLET | ORAL | 2 refills | Status: DC
Start: 2020-06-06 — End: 2020-08-17

## 2020-06-07 ENCOUNTER — Encounter

## 2020-06-08 MED ORDER — SERTRALINE 25 MG TAB
25 mg | ORAL_TABLET | Freq: Every day | ORAL | 5 refills | Status: AC
Start: 2020-06-08 — End: ?

## 2020-06-12 NOTE — Telephone Encounter (Signed)
Received call from [place] at [facility] with [employer] Complaint.    Subjective: Caller states "I have been having these pain in the past but not this severe. A lot of pressure. Reminds me of when I had my period. Discomfort in my stomach, pelvis. Seems like it is getting worse. Wakes me up in the morning. Pressure and cramps. My urine is brown color in the morning. I don't know if that is because I drink coffee. "     Current Symptoms: low abd pain, non-radiating. Pain in the morning and resolves in the past. Now coming back throughout the day.     Onset: several months ago; worsening. Was only in morning, now morning and throughout the day.   History of same issue in 1999. Every 2-3 years has recurrent issue.     Associated Symptoms: denies n/v/d    Pain Severity: 5/10; sore, grabbing/cramping, pressure, intermittent. Woke up 0500 with pain, went back to bed. Constant since woke up again this morning 0930.     Temperature: denies     What has been tried: nothing. States plans to take Ibuprofen after call.    LMP: hysterectomy Pregnant: No    Recommended disposition: Go to ED/UCC Now (Or to Office with PCP Approval) Explained reasoning, agreeable with plan.     Care advice provided, patient verbalizes understanding; denies any other questions or concerns; instructed to call back for any new or worsening symptoms.    Tried the office 3 times. Called ECC and spoke to Pump Back who assisted with reaching the office.   Writer provided warm transfer to [employer] at [employer] [employer] and [facility] for 2nd level triage for constant abd pain >2 hours    Attention Provider:  Thank you for allowing me to participate in the care of your patient.  The patient was connected to triage in response to information provided to the ECC.  Please do not respond through this encounter as the response is not directed to a shared pool.        Reason for Disposition  . Constant abdominal pain lasting > 2 hours    Protocols used: ABDOMINAL PAIN -  FEMALE-ADULT-[place]

## 2020-06-19 ENCOUNTER — Encounter: Attending: Adolescent Medicine | Primary: Adolescent Medicine

## 2020-06-29 ENCOUNTER — Ambulatory Visit: Payer: MEDICARE | Primary: Adolescent Medicine

## 2020-07-04 ENCOUNTER — Ambulatory Visit: Payer: MEDICARE | Primary: Adolescent Medicine

## 2020-07-04 ENCOUNTER — Encounter: Attending: Adolescent Medicine | Primary: Adolescent Medicine

## 2020-07-25 ENCOUNTER — Ambulatory Visit: Attending: Adolescent Medicine | Primary: Adolescent Medicine

## 2020-07-25 ENCOUNTER — Ambulatory Visit
Admit: 2020-07-25 | Discharge: 2020-07-25 | Payer: MEDICARE | Attending: Adolescent Medicine | Primary: Adolescent Medicine

## 2020-07-25 DIAGNOSIS — R3989 Other symptoms and signs involving the genitourinary system: Secondary | ICD-10-CM

## 2020-07-25 MED ORDER — LORATADINE 10 MG TAB
10 mg | ORAL_TABLET | ORAL | 0 refills | Status: AC
Start: 2020-07-25 — End: ?

## 2020-07-25 NOTE — Progress Notes (Signed)
 Shannon Burch is a 75 y.o. female    Chief Complaint   Patient presents with   . Urinary Frequency     ultrasound for bladder     1. Have you been to the ER, urgent care clinic since your last visit?  Hospitalized since your last visit? No     2. Have you seen or consulted any other health care providers outside of the [facility] since your last visit?  Include any pap smears or colon screening.  No

## 2020-07-25 NOTE — Progress Notes (Signed)
SPORTS MEDICINE AND PRIMARY CARE  Estrella Myrtle. Kimala Horne,MD  [address] [place] [address]    Chief Complaint   Patient presents with    Urinary Frequency     ultrasound for bladder       SUBJECTIVE:    Shannon Burch is a 75 y.o. female for evaluation of abnormal urination.    Patient reports 2-3 months of pressure over bladder  Urine is concentrated, dark, brown  No dysuria , frequenc will y or urgency  No pelvic/back pain or fever/chills  Water intake -6 bottles a day    PHQ is high  Pt denies si but agrees needing psychiatric follow up  She has had very low mood in recent months  She never initiated zoloft    Colonoscopy 05/2020  Hyperplastic polyp removed  Very constipated after procedure, now resolved    Itchy skin involving left elbow  Mosquitoes bites suspected because they congregate outside of her front door    Current Outpatient Medications   Medication Sig Dispense Refill    loratadine (Claritin) 10 mg tablet Take 1 po every day prn itchy skin 10 Tablet 0    gabapentin (Neurontin) 800 mg tablet Take 1 tab po in morning &  2 tablets at night for foot neuropathy 90 Tablet 2    fluticasone propionate (Flonase Allergy Relief) 50 mcg/actuation nasal spray 2 Sprays by Both Nostrils route daily. 1 Each 2    butalbital-acetaminophen-caffeine (FIORICET, ESGIC) 50-325-40 mg per tablet Take 1 Tablet by mouth every four (4) hours as needed for Headache. 10 Tablet 0    ibuprofen (MOTRIN) 800 mg tablet Take 800 mg by mouth as needed for Pain.      sertraline (ZOLOFT) 25 mg tablet Take 1 Tablet by mouth daily. (Patient not taking: Reported on 07/25/2020) 30 Tablet 5     Past Medical History:   Diagnosis Date    Arthritis      Past Surgical History:   Procedure Laterality Date    COLONOSCOPY N/A 05/30/2020    COLONOSCOPY performed by [name], MD at [facility] END[facility]COPY     Allergies   Allergen Reactions    Tbo-Filgrastim Hives    Penicillins Hives and Itching       REVIEW OF SYSTEMS:  General: negative for - chills or  fever  ENT: negative for - headaches, nasal congestion, tinnitus, hearing loss, vision changes, sore throat  Respiratory: negative for - cough, hemoptysis, shortness of breath or wheezing  Cardiovascular : negative for - chest pain, edema, palpitations or shortness of breath  Gastrointestinal: negative for - abdominal pain, blood in stools, heartburn or nausea/vomiting, diarrhea, constipation  Genito-Urinary: + Abnormal urination ;no dysuria, trouble voiding, hematuria   Musculoskeletal: negative for - gait disturbance, joint pain, joint stiffness , joint swelling, muscle aches  Neurological: no TIA or stroke symptoms  Hematologic: no bruises, no bleeding, no swollen glands  Integument: +itching; no lumps, mole changes, nail changes or rash  Endocrine:no malaise/lethargy or unexpected weight changes      Social History     Socioeconomic History    Marital status: SINGLE   Tobacco Use    Smoking status: Former     Types: Cigarettes     Quit date: 2011     Years since quitting: 11.5    Smokeless tobacco: Never   Vaping Use    Vaping Use: Never used   Substance and Sexual Activity    Alcohol use: Never    Drug  use: Never    Sexual activity: Never     History reviewed. No pertinent family history.    OBJECTIVE:     Visit Vitals  BP 131/66 (BP 1 Location: Left upper arm, BP Patient Position: Sitting)   Pulse 66   Temp 98.7 ??F (37.1 ??C) (Oral)   Resp 18   Ht 5\' 5"  (1.651 m)   Wt 132 lb 9.6 oz (60.1 kg)   SpO2 99%   BMI 22.07 kg/m??     CONSTITUTIONAL: Pleasant, cooperative, in no acute distress  EYES: eom intact  ENMT:moist mucous membranes,   NECK: supple.   RESPIRATORY: Chest: clear bilaterally  CARDIOVASCULAR: Heart: regular rate and rhythm  GASTROINTESTINAL: Abdomen: soft, no CVAT, no SPT  MUSCULOSKELETAL: Extremities: no edema,  INTEGUMENT: Warm and dry.   MENTAL STATUS: alert,     ASSESSMENT/PLAN:   1. Abnormal urine color -rule out infection, stones, neoplasm   2. Itchy skin -suspect skin allergy   3. Severe episode  of recurrent major depressive disorder, without psychotic features (HCC)      .  Orders Placed This Encounter    CULTURE, URINE    URINALYSIS W/ RFLX MICROSCOPIC    METABOLIC PANEL, BASIC    REFERRAL TO PSYCHIATRY    loratadine (Claritin) 10 mg tablet       Follow-up and Dispositions    Return in about 3 months (around 10/25/2020) for medication follow up.         I have discussed the diagnosis with the patient and the intended plan as seen in the  orders above.  The patient understands and agrees with the plan.  The patient has   received an after visit summary. Questions were answered concerning  future plans  Patient labs and/or xrays were reviewed as available.  Past records were reviewed as available.    Counseled regarding  healthy lifestyle          Advised patient to proceed to urgent care, call back or return to office if symptoms develop/worsen/change/persist.  Discussed expected course/resolution/complications of diagnosis in detail with patient.     Medication risks/benefits/costs/interactions/alternatives discussed with patient    Signed,  10/27/2020 M.D.      This note was created using voice recognition software.  Edits have been made but syntax errors might exist.

## 2020-07-26 LAB — URINALYSIS W/ RFLX MICROSCOPIC
Bilirubin, Urine: NEGATIVE
Bilirubin: NEGATIVE
Blood, Urine: NEGATIVE
Blood: NEGATIVE
Glucose, Ur: NEGATIVE mg/dL
Glucose: NEGATIVE mg/dL
Ketone: NEGATIVE mg/dL
Ketones, Urine: NEGATIVE mg/dL
Leukocyte Esterase, Urine: NEGATIVE
Leukocyte Esterase: NEGATIVE
Nitrite, Urine: NEGATIVE
Nitrites: NEGATIVE
Protein, UA: NEGATIVE mg/dL
Protein: NEGATIVE mg/dL
Specific Gravity, UA: 1.013 (ref 1.003–1.030)
Specific gravity: 1.013 (ref 1.003–1.030)
Urobilinogen, UA, POCT: 0.2 EU/dL (ref 0.2–1.0)
Urobilinogen: 0.2 EU/dL (ref 0.2–1.0)
pH (UA): 5.5 (ref 5.0–8.0)
pH, UA: 5.5 (ref 5.0–8.0)

## 2020-07-26 LAB — CULTURE, URINE
Culture result:: NO GROWTH
Culture: NO GROWTH

## 2020-08-17 ENCOUNTER — Encounter

## 2020-08-18 ENCOUNTER — Encounter

## 2020-08-18 MED ORDER — GABAPENTIN 800 MG TAB
800 mg | ORAL_TABLET | ORAL | 2 refills | Status: DC
Start: 2020-08-18 — End: 2020-09-04

## 2020-08-18 NOTE — Telephone Encounter (Signed)
Rx were sent to wrong pharmacy

## 2020-08-19 IMAGING — CT CT HEAD W/O CM
3 series · 16 of 47 positions shown, 19 images · non-contrast
Comparison: None.

CLINICAL DATA: Headache.

EXAM:
CT HEAD WITHOUT CONTRAST
TECHNIQUE: Contiguous axial images were obtained from the base of the skull
through the vertex without intravenous contrast.

[Series 2: head wo · axial · 0.39mm/px · z∈[-199,-69]mm · 10 of 32 slices shown, 13 images]
[im 3/32  brain]
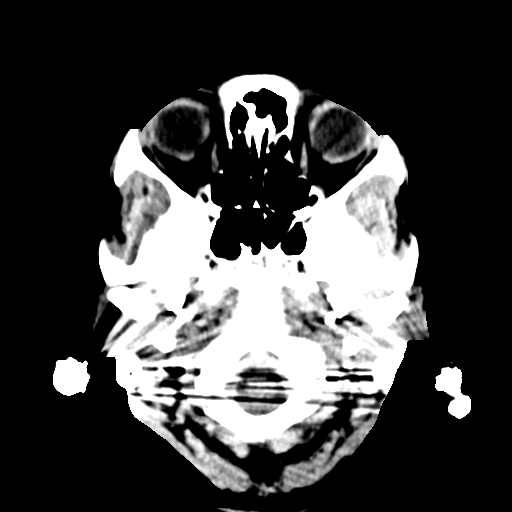
[im 3/32  bone]
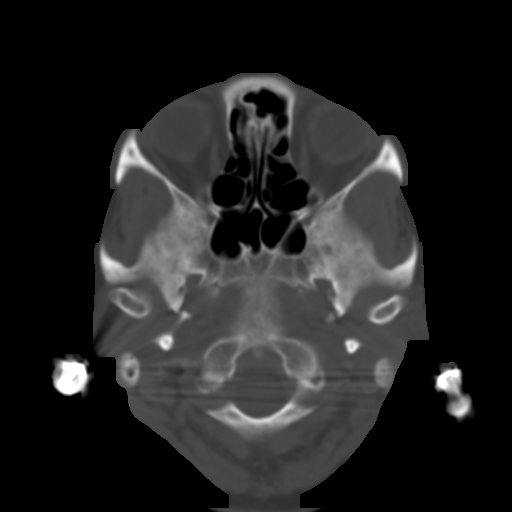
[im 6/32  brain]
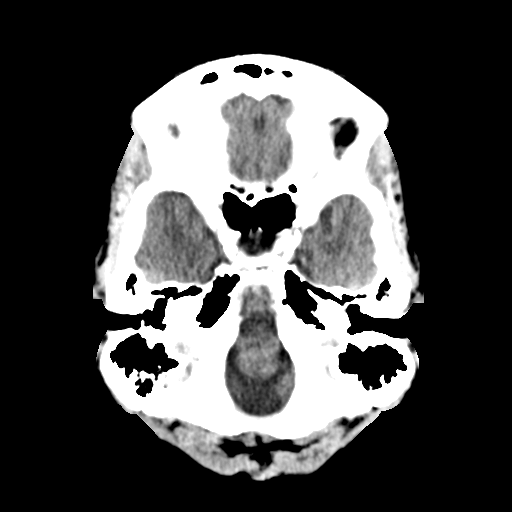
[im 9/32  brain]
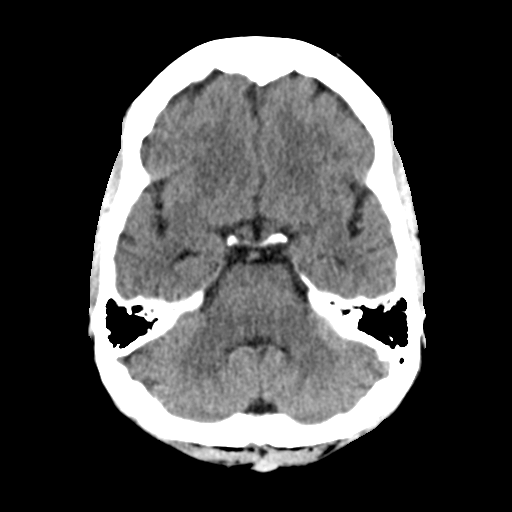
[im 11/32  brain]
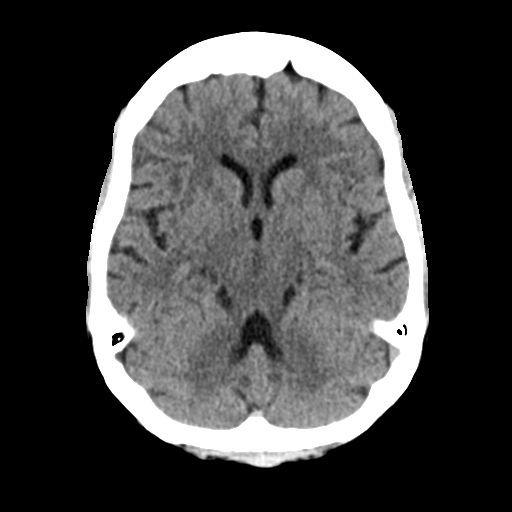
[im 14/32  brain]
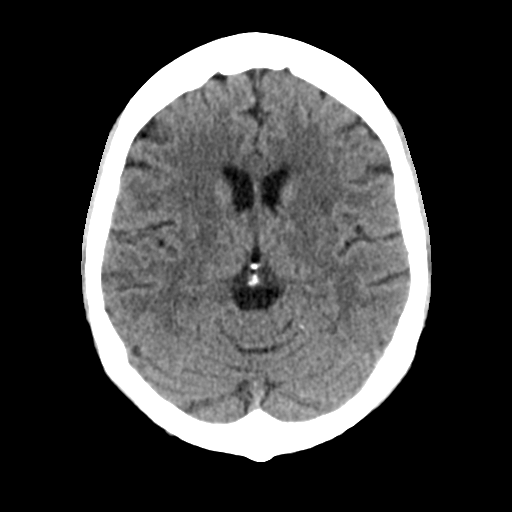
[im 14/32  bone]
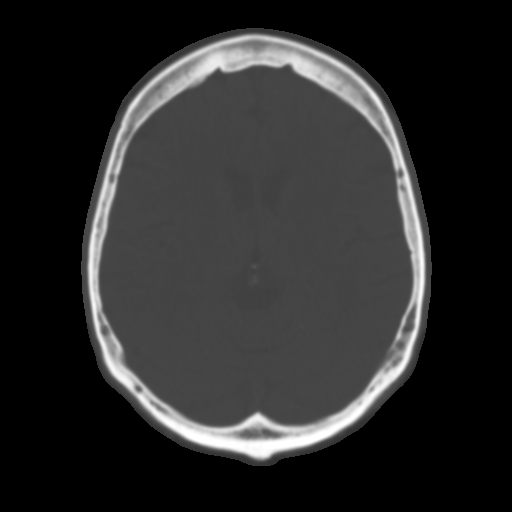
[im 18/32  brain]
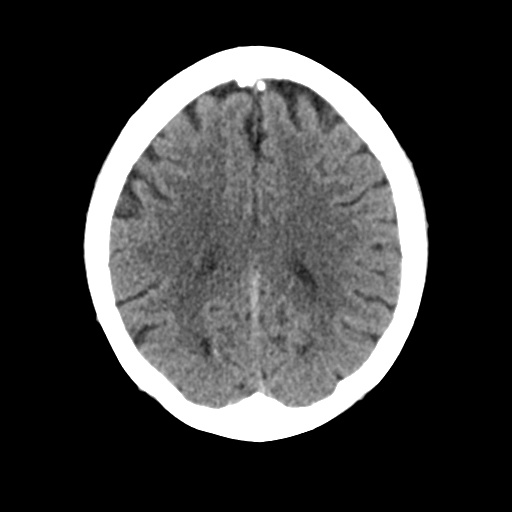
[im 21/32  brain]
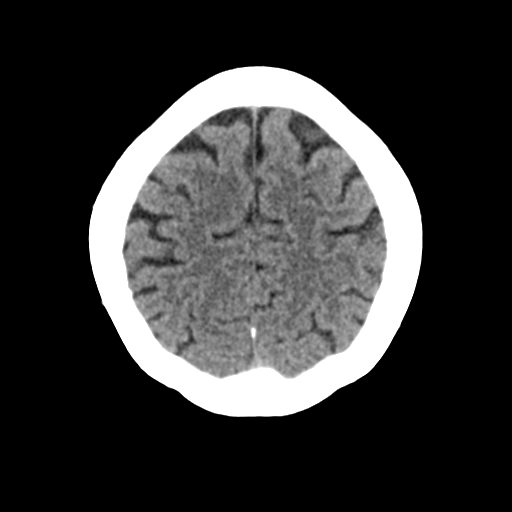
[im 24/32  brain]
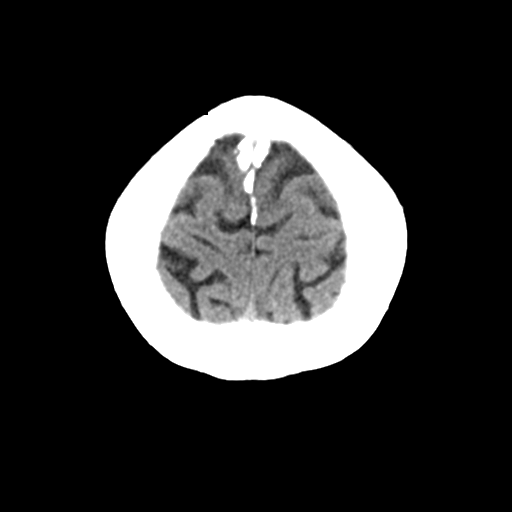
[im 26/32  brain]
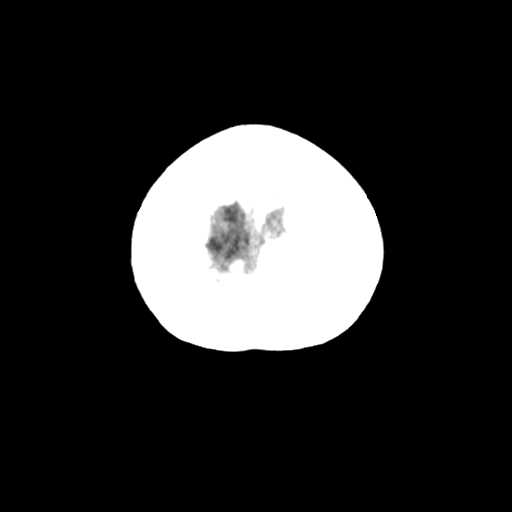
[im 26/32  bone]
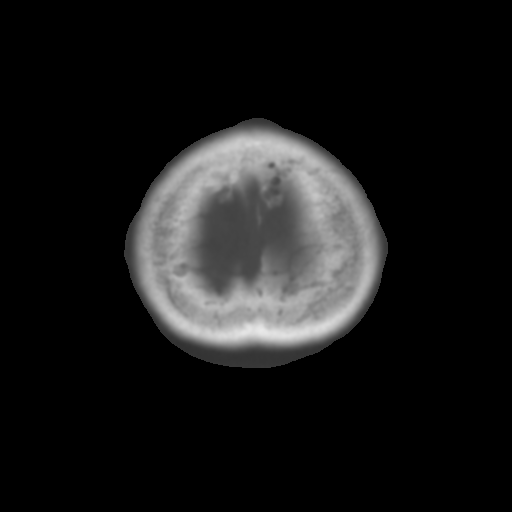
[im 29/32  brain]
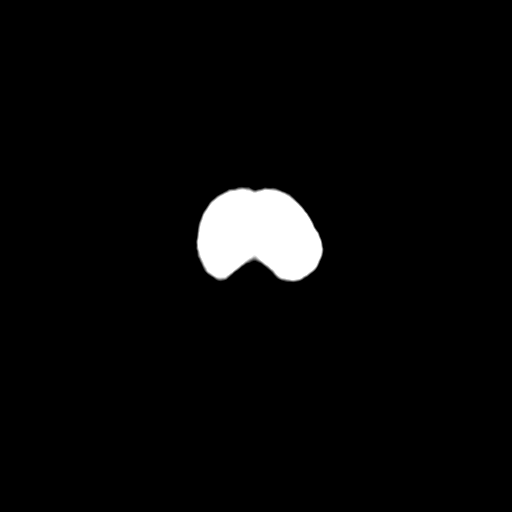

[Series 4: head wo coronal · coronal · 0.32mm/px · 3 of 64 slices shown]
[im 22/64  brain]
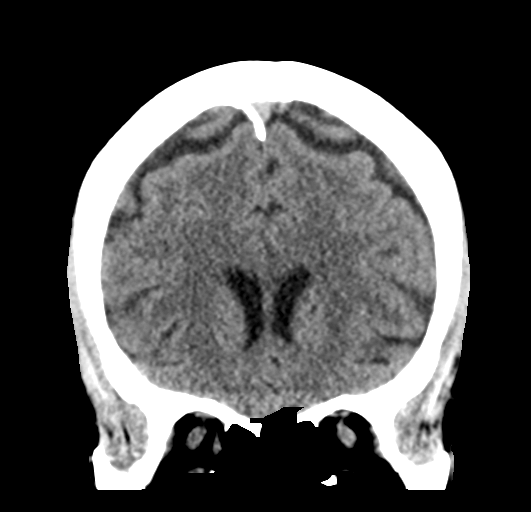
[im 29/64  brain]
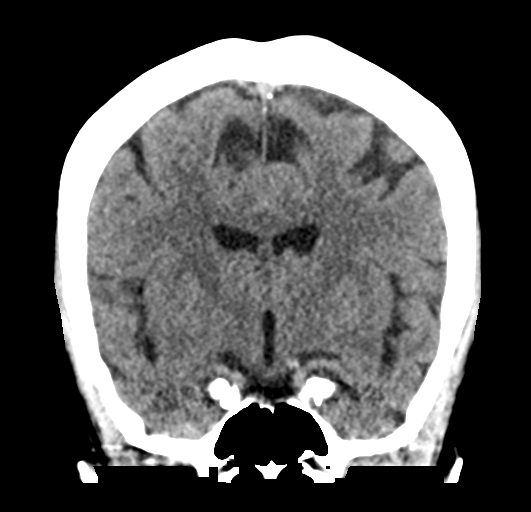
[im 36/64  brain]
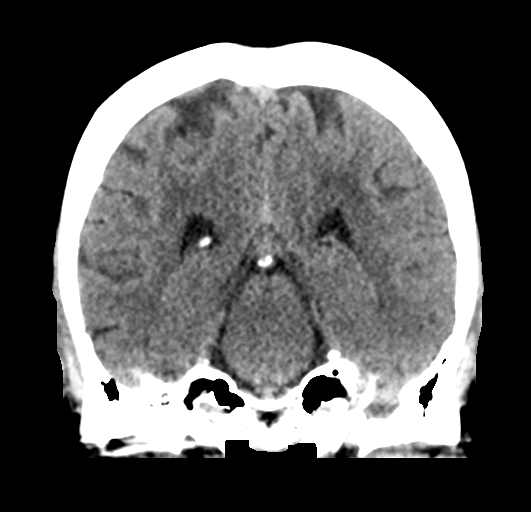

[Series 5: head wo sagittal · sagittal · 0.34mm/px · 3 of 53 slices shown]
[im 18/53  brain]
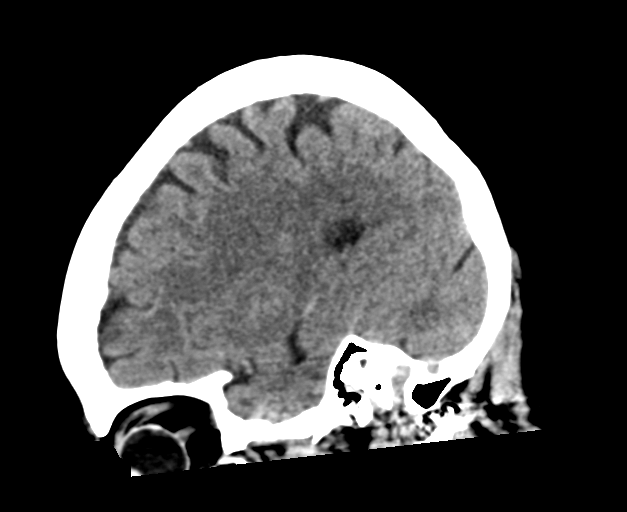
[im 27/53  brain]
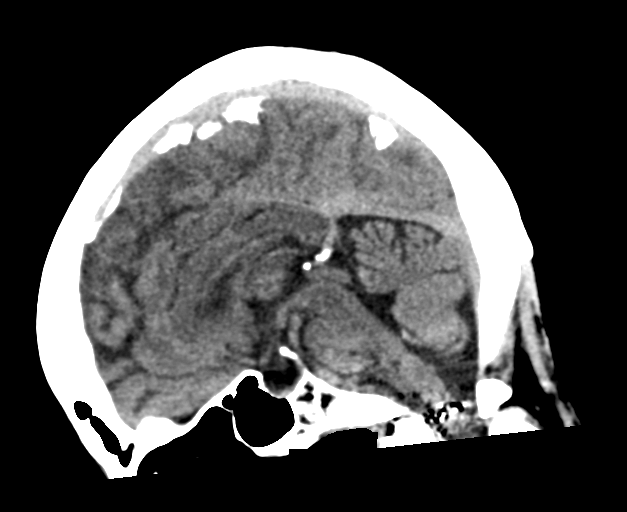
[im 35/53  brain]
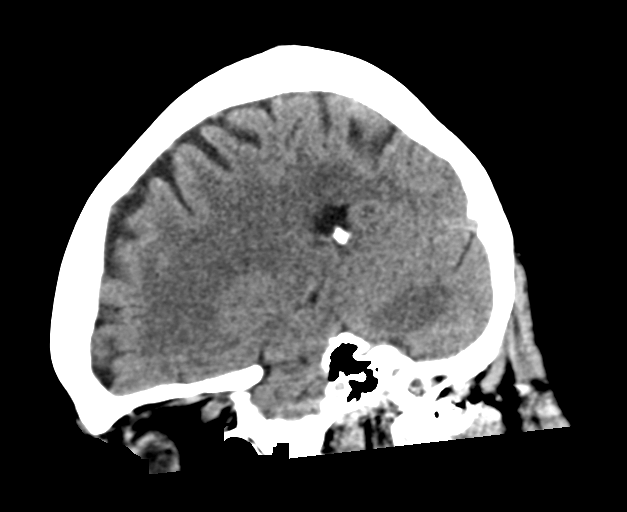

[16 of 47 positions shown; findings below may reference images not displayed]

FINDINGS: Brain: No evidence of acute infarction, hemorrhage, hydrocephalus,
extra-axial collection or mass lesion/mass effect.

Vascular: No hyperdense vessel or unexpected calcification.

Skull: Normal. Negative for fracture or focal lesion.

Sinuses/Orbits: No acute finding.

Other: None.
IMPRESSION: Normal head CT.

## 2020-08-21 ENCOUNTER — Encounter: Attending: Adolescent Medicine | Primary: Adolescent Medicine

## 2020-08-22 MED ORDER — IBUPROFEN 800 MG TAB
800 mg | ORAL_TABLET | ORAL | 0 refills | Status: DC
Start: 2020-08-22 — End: 2020-09-04

## 2020-09-04 ENCOUNTER — Encounter

## 2020-09-04 MED ORDER — IBUPROFEN 800 MG TAB
800 mg | ORAL_TABLET | ORAL | 0 refills | Status: AC
Start: 2020-09-04 — End: 2020-11-17

## 2020-09-04 MED ORDER — GABAPENTIN 800 MG TAB
800 mg | ORAL_TABLET | ORAL | 2 refills | Status: DC
Start: 2020-09-04 — End: 2020-11-14

## 2020-09-19 NOTE — Telephone Encounter (Signed)
Patient is stating that the gabapentin that she has received is not working for her. Patient stated that you'll discuss it. Patient states do not want to take generic med any longer she needs the brand name rx. Patient would like a call [phone]. Please advise? Thanks!

## 2020-10-13 ENCOUNTER — Encounter: Payer: MEDICARE | Attending: Adolescent Medicine | Primary: Adolescent Medicine

## 2020-10-13 NOTE — Telephone Encounter (Signed)
 Received call from [place] at [facility] with [employer] Complaint.    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 It is in my toes and feet. What's happening is I relocated here a year now, I was on Gabapentin 800mg  name brand. She changed all my medications and put me on generic and it isn't working. She should have referred me to a neurologist to get this prescription rather than prescribing it. I feel like she wasn't concerned because she wrote for the generic. [employer] called her and she stated she wasn't sure why I needed the name brand. I am walking crippled. It was under control when I was taking Gabapentin.      Current Symptoms: when weather gets cold increases pain. Has to increase heat in house and take baths. Chronic swelling of right foot s/p fracture Jan 2020.     Patient requesting change on Gabapentin back to name brand from generic. Per ECC patient called prior and was transferred to office but call was disconnected. Patient called back, ECC transferred to nurse triage.     Documentation 09/20/20 shows documentation from pt to MD regarding Gabapentin. PCP responded stating to schedule appt for new rx and to bring medication with her. Pt states she had an appt on the 20th but she changed it for today.She canceled her appt today because her foot hurts and she cannot drive. States she also doesn't have the bottles anymore. She wants to know if the appt is still available for the 20th. Pt voicing frustration about PCP and process about requiring in person appt rather than virtual visit. Will transfer to the office for further assistance regarding appt.       Recommended disposition:  callback by PCP today    Care advice provided, patient verbalizes understanding; denies any other questions or concerns; instructed to call back for any new or worsening symptoms.    Writer provided warm transfer to [employer] at [employer] and Primary Care for reschedule and  further assistance regarding medication care  Renee stated she was aware of pt's situation and could assist further.    Attention Provider:  Thank you for allowing me to participate in the care of your patient.  The patient was connected to triage in response to information provided to the ECC.  Please do not respond through this encounter as the response is not directed to a shared pool.      Reason for Disposition   Caller wants to use a complementary or alternative medicine    Protocols used: Medication Question Call-ADULT-[place]

## 2020-10-26 ENCOUNTER — Encounter: Attending: Adolescent Medicine | Primary: Adolescent Medicine

## 2020-11-13 ENCOUNTER — Ambulatory Visit: Payer: MEDICARE | Attending: Adolescent Medicine | Primary: Adolescent Medicine

## 2020-11-13 ENCOUNTER — Encounter: Attending: Adolescent Medicine | Primary: Adolescent Medicine

## 2020-11-14 ENCOUNTER — Ambulatory Visit
Admit: 2020-11-14 | Discharge: 2020-11-14 | Payer: MEDICARE | Attending: Adolescent Medicine | Primary: Adolescent Medicine

## 2020-11-14 NOTE — Progress Notes (Signed)
Chief Complaint   Patient presents with    Medication Evaluation    Medication Refill    Anal Pain       Vitals:    11/14/20 1130   BP: 118/70   Pulse: 70   Resp: 18   Temp: 98.7 F (37.1 C)   TempSrc: Oral   SpO2: 96%   Weight: 127 lb (57.6 kg)   Height: 5\' 5"  (1.651 m)   PainSc:   0 - No pain         Health Maintenance will be followed up by PCP.       1. Have you been to the ER, urgent care clinic since your last visit?  Hospitalized since your last visit?No    2. Have you seen or consulted any other health care providers outside of the [facility] since your last visit?  Include any pap smears or colon screening. No

## 2020-11-14 NOTE — Progress Notes (Signed)
[facility]  Estrella Myrtle. Jocelyn Lowery,MD  [address] [place] [address]    Chief Complaint   Patient presents with    Medication Evaluation    Medication Refill    Anal Pain       SUBJECTIVE:    Shannon Burch is a 75 y.o. female for rectal pain evaluation.    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 Take 1 tab po in morning &  2 tablets at night for foot neuropathy 90 Tablet 2    fluticasone propionate (Flonase Allergy Relief) 50 mcg/actuation nasal spray 2 Sprays by Both Nostrils route daily. 1 Each 2    ibuprofen (MOTRIN) 800 mg tablet Take 1 by mouth twice a day at least 8 hours apart for pain.  Take with food or milk 20 Tablet 0    loratadine (Claritin) 10 mg tablet Take 1 po every day prn itchy skin (Patient not taking: Reported on 11/14/2020) 10 Tablet 0    sertraline (ZOLOFT) 25 mg tablet Take 1 Tablet by mouth daily. (Patient not taking: No sig reported) 30 Tablet 5     Past Medical History:   Diagnosis Date    Arthritis      Past Surgical History:   Procedure Laterality Date    COLONOSCOPY N/A 05/30/2020    COLONOSCOPY performed by [name], MD at [facility] ENDOSCOPY     Allergies   Allergen Reactions    Tbo-Filgrastim Hives    Penicillins Hives and Itching       REVIEW OF SYSTEMS:  General: negative for - chills or fever  ENT: negative for - headaches, nasal congestion, tinnitus, hearing loss, vision changes, sore throat  Respiratory: negative for - cough, hemoptysis, shortness of breath or wheezing  Cardiovascular :  negative for - chest pain, edema, palpitations or shortness of breath  Gastrointestinal: negative for - abdominal pain, blood in stools, heartburn or nausea/vomiting, diarrhea, constipation  Genito-Urinary: no dysuria, trouble voiding, hematuria   Musculoskeletal: negative for - gait disturbance, joint pain, joint stiffness , joint swelling, muscle aches  Neurological: no TIA or stroke symptoms  Hematologic: no bruises, no bleeding, no swollen glands  Integument: no lumps, mole changes, nail changes or rash  Endocrine:no malaise/lethargy or unexpected weight changes      Social History     Socioeconomic History    Marital status: SINGLE   Tobacco Use    Smoking status: Former     Types: Cigarettes     Quit date: 2011     Years since quitting: 11.9    Smokeless tobacco: Never   Vaping Use    Vaping Use: Never used   Substance and Sexual Activity    Alcohol use: Never    Drug use: Never    Sexual activity: Never     History reviewed. No pertinent family history.    OBJECTIVE:     Visit Vitals  BP 118/70 (BP 1 Location: Left upper arm, BP Patient Position:  Sitting, BP Cuff Size: Adult)   Pulse 70   Temp 98.7 ??F (37.1 ??C) (Oral)   Resp 18   Ht 5\' 5"  (1.651 m)   Wt 127 lb (57.6 kg)   SpO2 96%   BMI 21.13 kg/m??     CONSTITUTIONAL: appears healthy  EYES: perrla, eom intact  ENMT:moist mucous membranes  NECK: supple. Thyroid normal  RESPIRATORY: Chest: clear bilaterally  CARDIOVASCULAR: Heart: regular rate and rhythm  GASTROINTESTINAL: Abdomen: soft, bowel sounds active  RECTAL: anal skin tag  HEMATOLOGIC: no pathological lymph nodes palpated  MUSCULOSKELETAL: Extremities: no edema, pulse 1+   INTEGUMENT: warm and dry  NEUROLOGIC: non-focal exam   MENTAL STATUS: alert and oriented, appropriate affect     ASSESSMENT/ PLAN:  1. Anal skin tag    2. 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 Medication management      .  Orders Placed This Encounter    REFERRAL TO COLON AND RECTAL SURGERY    gabapentin (Neurontin) 800 mg tablet      I have discussed the diagnosis with the patient and the intended plan as seen in the  orders above.  The patient understands and agrees with the plan.  The patient has   received an after visit summary. Questions were answered concerning  future plans  Patient labs and/or xrays were reviewed as available.  Past records were reviewed as available.    Counseled regarding healthy lifestyle          Advised patient to call back or return to office if symptoms develop/worsen/change/persist.  Discussed expected course/resolution/complications of diagnosis in detail with patient.     Medication risks/benefits/costs/interactions/alternatives reviewed    Signed,  M.D.      This note was created using voice recognition software.  Edits have been made but syntax errors might exist.

## 2020-11-22 MED ORDER — IBUPROFEN 800 MG TAB
800 mg | ORAL_TABLET | ORAL | 0 refills | Status: AC
Start: 2020-11-22 — End: 2020-12-14

## 2020-12-05 DIAGNOSIS — K644 Residual hemorrhoidal skin tags: Secondary | ICD-10-CM

## 2020-12-05 MED ORDER — GABAPENTIN 800 MG TAB
800 mg | ORAL_TABLET | ORAL | 2 refills | Status: AC
Start: 2020-12-05 — End: ?

## 2020-12-14 ENCOUNTER — Encounter

## 2020-12-15 MED ORDER — IBUPROFEN 800 MG TAB
800 mg | ORAL_TABLET | ORAL | 0 refills | Status: AC
Start: 2020-12-15 — End: ?

## 2021-04-23 ENCOUNTER — Encounter: Attending: Internal Medicine | Primary: Adolescent Medicine

## 2021-05-14 ENCOUNTER — Encounter: Attending: Internal Medicine | Primary: Adolescent Medicine
